# Patient Record
Sex: Male | Born: 1960 | Marital: Married | Smoking: Former smoker
Health system: Southern US, Community
[De-identification: ages and names within clinical notes are randomized; demographics above are authoritative.]

## PROBLEM LIST (undated history)

## (undated) DIAGNOSIS — E119 Type 2 diabetes mellitus without complications: Secondary | ICD-10-CM

## (undated) DIAGNOSIS — I10 Essential (primary) hypertension: Secondary | ICD-10-CM

## (undated) DIAGNOSIS — E785 Hyperlipidemia, unspecified: Secondary | ICD-10-CM

## (undated) HISTORY — DX: Essential (primary) hypertension: I10

## (undated) HISTORY — PX: GALLBLADDER SURGERY: SHX652

## (undated) HISTORY — DX: Hyperlipidemia, unspecified: E78.5

## (undated) HISTORY — DX: Type 2 diabetes mellitus without complications: E11.9

---

## 2011-12-02 ENCOUNTER — Inpatient Hospital Stay: Payer: Self-pay | Admitting: Internal Medicine

## 2011-12-02 LAB — URINALYSIS, COMPLETE
Leukocyte Esterase: NEGATIVE
Nitrite: NEGATIVE
Ph: 5 (ref 4.5–8.0)
Protein: 100
RBC,UR: 18 /HPF (ref 0–5)
Squamous Epithelial: NONE SEEN

## 2011-12-02 LAB — COMPREHENSIVE METABOLIC PANEL
Anion Gap: 17 — ABNORMAL HIGH (ref 7–16)
BUN: 16 mg/dL (ref 7–18)
Bilirubin,Total: 2.4 mg/dL — ABNORMAL HIGH (ref 0.2–1.0)
Co2: 18 mmol/L — ABNORMAL LOW (ref 21–32)
Creatinine: 0.73 mg/dL (ref 0.60–1.30)
EGFR (African American): >60
EGFR (Non-African Amer.): >60
Glucose: 205 mg/dL — ABNORMAL HIGH (ref 65–99)
Osmolality: 281 (ref 275–301)
Potassium: 4.6 mmol/L (ref 3.5–5.1)

## 2011-12-02 LAB — CBC
MCV: 86 fL (ref 80–100)
Platelet: 294 10*3/uL (ref 150–440)
WBC: 8.6 10*3/uL (ref 3.8–10.6)

## 2011-12-02 LAB — LIPASE, BLOOD: Lipase: 2842 U/L — ABNORMAL HIGH (ref 73–393)

## 2011-12-03 LAB — LIPID PANEL
Cholesterol: 209 mg/dL — ABNORMAL HIGH (ref 0–200)
HDL Cholesterol: 16 mg/dL — ABNORMAL LOW (ref 40–60)
Triglycerides: 864 mg/dL — ABNORMAL HIGH (ref 0–200)

## 2011-12-03 LAB — COMPREHENSIVE METABOLIC PANEL
Alkaline Phosphatase: 83 U/L (ref 50–136)
Bilirubin,Total: 2.2 mg/dL — ABNORMAL HIGH (ref 0.2–1.0)
Calcium, Total: 8.2 mg/dL — ABNORMAL LOW (ref 8.5–10.1)
Co2: 25 mmol/L (ref 21–32)
EGFR (African American): >60
EGFR (Non-African Amer.): >60
Glucose: 224 mg/dL — ABNORMAL HIGH (ref 65–99)
Osmolality: 285 (ref 275–301)
Sodium: 139 mmol/L (ref 136–145)
Total Protein: 6.8 g/dL (ref 6.4–8.2)

## 2011-12-03 LAB — CBC WITH DIFFERENTIAL/PLATELET
Eosinophil %: 0.3 %
HGB: 13.5 g/dL (ref 13.0–18.0)
Lymphocyte %: 32.7 %
MCH: 29.3 pg (ref 26.0–34.0)
Monocyte #: 0.2 10*3/uL (ref 0.0–0.7)
Monocyte %: 6.5 %
Neutrophil %: 60.3 %
Platelet: 196 10*3/uL (ref 150–440)
RBC: 4.59 10*6/uL (ref 4.40–5.90)
WBC: 3.7 10*3/uL — ABNORMAL LOW (ref 3.8–10.6)

## 2011-12-04 LAB — LIPASE, BLOOD: Lipase: 451 U/L — ABNORMAL HIGH (ref 73–393)

## 2011-12-05 LAB — BASIC METABOLIC PANEL
Anion Gap: 12 (ref 7–16)
BUN: 14 mg/dL (ref 7–18)
Calcium, Total: 9.2 mg/dL (ref 8.5–10.1)
Chloride: 100 mmol/L (ref 98–107)
Co2: 24 mmol/L (ref 21–32)
EGFR (Non-African Amer.): >60
Glucose: 353 mg/dL — ABNORMAL HIGH (ref 65–99)
Osmolality: 287 (ref 275–301)
Potassium: 3.7 mmol/L (ref 3.5–5.1)

## 2011-12-05 LAB — HEMOGLOBIN A1C: Hemoglobin A1C: 9.1 % — ABNORMAL HIGH (ref 4.2–6.3)

## 2011-12-05 LAB — LIPASE, BLOOD: Lipase: 206 U/L (ref 73–393)

## 2012-01-28 ENCOUNTER — Ambulatory Visit: Payer: Self-pay | Admitting: Rheumatology

## 2013-07-21 ENCOUNTER — Emergency Department: Payer: Self-pay | Admitting: Unknown Physician Specialty

## 2013-07-21 LAB — COMPREHENSIVE METABOLIC PANEL
Albumin: 4.4 g/dL (ref 3.4–5.0)
Alkaline Phosphatase: 81 U/L (ref 50–136)
Anion Gap: 5 — ABNORMAL LOW (ref 7–16)
BUN: 21 mg/dL — ABNORMAL HIGH (ref 7–18)
Calcium, Total: 9.7 mg/dL (ref 8.5–10.1)
Chloride: 102 mmol/L (ref 98–107)
Co2: 28 mmol/L (ref 21–32)
EGFR (African American): >60
EGFR (Non-African Amer.): >60
Glucose: 141 mg/dL — ABNORMAL HIGH (ref 65–99)
Osmolality: 275 (ref 275–301)
Potassium: 3.7 mmol/L (ref 3.5–5.1)
SGPT (ALT): 38 U/L (ref 12–78)
Total Protein: 8.3 g/dL — ABNORMAL HIGH (ref 6.4–8.2)

## 2013-07-21 LAB — URINALYSIS, COMPLETE
Bacteria: NONE SEEN
Bilirubin,UR: NEGATIVE
Blood: NEGATIVE
Glucose,UR: NEGATIVE mg/dL (ref 0–75)
Leukocyte Esterase: NEGATIVE
Nitrite: NEGATIVE
Ph: 5 (ref 4.5–8.0)
Protein: NEGATIVE
RBC,UR: <1 /HPF (ref 0–5)
Specific Gravity: 1.005 (ref 1.003–1.030)
Squamous Epithelial: <1
WBC UR: <1 /HPF (ref 0–5)

## 2013-07-21 LAB — TSH: Thyroid Stimulating Horm: 0.312 u[IU]/mL — ABNORMAL LOW

## 2013-07-21 LAB — CBC
HCT: 42.5 % (ref 40.0–52.0)
HGB: 14.2 g/dL (ref 13.0–18.0)
MCH: 28.9 pg (ref 26.0–34.0)
MCHC: 33.5 g/dL (ref 32.0–36.0)
MCV: 87 fL (ref 80–100)
RBC: 4.92 10*6/uL (ref 4.40–5.90)
RDW: 13.6 % (ref 11.5–14.5)
WBC: 6.9 10*3/uL (ref 3.8–10.6)

## 2013-07-21 LAB — TROPONIN I: Troponin-I: <0.02 ng/mL

## 2015-03-12 IMAGING — CR DG CHEST 1V PORT
1 series · 1 of 1 positions shown · non-contrast
Comparison: none

REASON FOR EXAM: dizziness
COMMENTS:

PROCEDURE:     DXR - DXR PORTABLE CHEST SINGLE VIEW  - July 21, 2013 [DATE]
RESULT:     Comparison is made to prior study dated 12/03/2011.

[x chest ap]
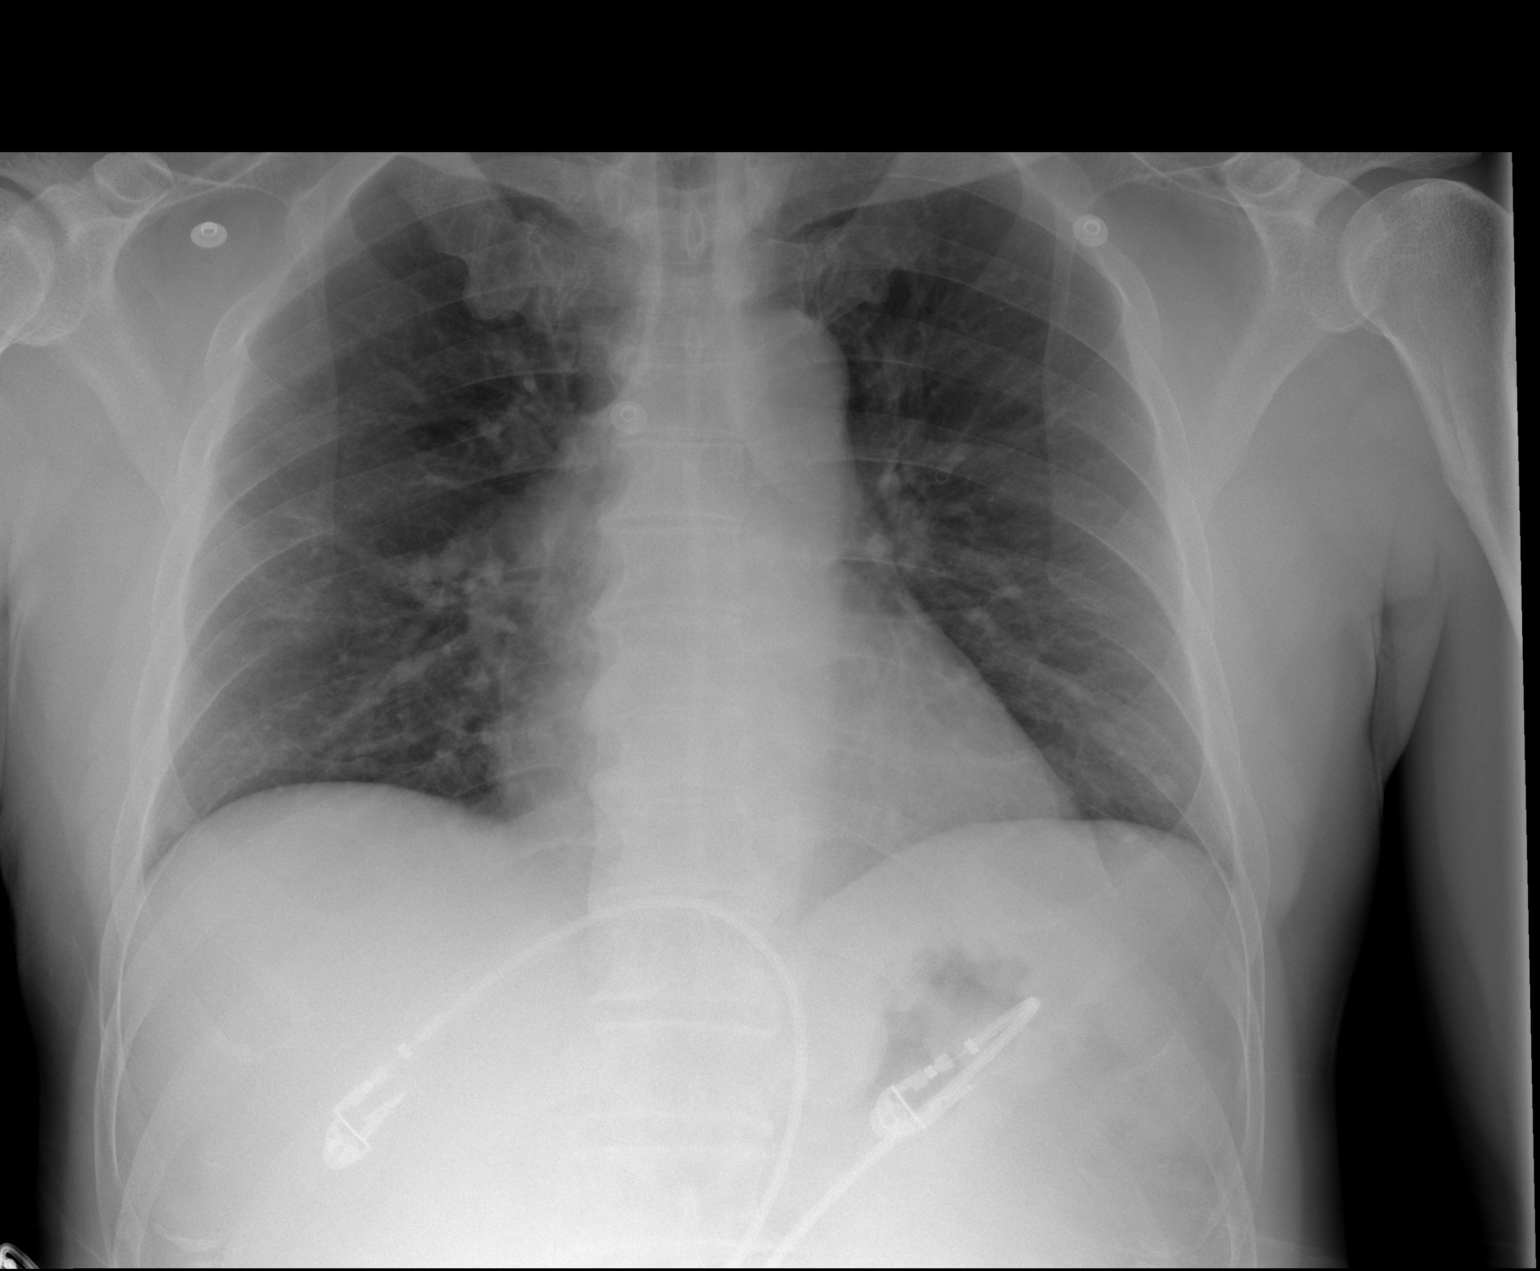

[1 of 1 positions shown; findings below may reference images not displayed]

FINDINGS: The patient has taken a shallow inspiration. There is fullness in
the right hilar region. This may be secondary to technique and repeat
two-view evaluation with deeper inspiration is recommended. Otherwise, there
is no further evidence of focal infiltrates, effusions or edema. The cardiac
silhouette and visualized bony skeleton are unremarkable.
IMPRESSION: 1. Fullness within the right hilar region as described above. Repeat
evaluation recommended
2. No further evidence of focal infiltrates, effusions or edema.

## 2015-03-24 NOTE — Discharge Summary (Signed)
PATIENT NAME:  Phillip Rodgers, Phillip Rodgers MR#:  [id number] DATE OF BIRTH:  [birth year]  DATE OF ADMISSION:  12/02/2011 DATE OF DISCHARGE:  12/05/2011  PRIMARY CARE PHYSICIAN:  The patient's primary care physician will be at the [facility].  FINAL DIAGNOSES:  1. Acute pancreatitis.  2. Hypertriglyceridemia.  3. Hypertension.  4. Diabetes.  5. Lung nodule.  6. Adrenal nodule.  7. Chronic obstructive pulmonary disease exacerbation.   DISCHARGE INSTRUCTIONS: The patient will need a CT scan of the chest and abdomen in three months.   MEDICATIONS ON DISCHARGE:  1. DuoNeb nebulizer solution as needed.  2. Combivent 2 puffs 4 times daily.  3. Multivitamin 1 tablet daily.  4. Red yeast rice 600 mg 1 capsule daily.  5. Aspirin 81 mg daily.  6. Gemfibrozil 600 mg p.o. twice a day. 7. Metoprolol 25 mg p.o. twice a day. 8. Glipizide XL 5 mg p.o. daily.  9. Percocet 5/325 1 tablet every four as needed for pain.  10. Prednisone taper as written on prescription.  11. Check fingersticks once a day.   DIET: Low sodium, 1800 ADA diet.   ACTIVITY: Activity as tolerated.   FOLLOW-UP: Follow-up at the [facility] in two weeks or first available.   REASON FOR ADMISSION: The patient was admitted 12/02/2011 and discharge 12/05/2011. The patient was admitted with abdominal pain.   HISTORY OF PRESENT ILLNESS: This is a 54 year old male who has a history of pancreatitis from gallstones nine years ago. His gallbladder was removed at that time. He started having mid epigastric abdominal pain followed by nausea and vomiting. In the Emergency Room he was found to have an acute pancreatitis. He was admitted with acute pancreatitis and given vigorous IV fluid hydration.   LABORATORY, DIAGNOSTIC, AND RADIOLOGICAL DATA: Urinalysis showed 100 mg/dL of protein, 3+ blood, glucose 500 mg/dL. Lipase 2842, glucose 205, BUN 16, creatinine 0.73, sodium 137, potassium 4.6, chloride 102, CO2 18, calcium 9.2, total bilirubin  2.4, alkaline phosphatase 119, ALT 127, AST 64, total protein 8.5, albumin 4.2, anion gap 17. White blood cell count 8.6, hemoglobin and hematocrit 14.8 and hematocrit 43.7, platelet count 294. CT scan of the abdomen and pelvis showed findings of acute pancreatitis. No peripancreatic fluid collection at this time. Findings may represent a slightly enlarged lymph node just anterior to the stomach, nonspecific, indeterminate left adrenal nodule, indeterminate 4 mm nodule in the left lower lobe of the lung. Total cholesterol 209, triglycerides 864, HDL 16. Ultrasound of the abdomen showed the pancreas is obscured. No pseudocyst or fluid seen, fatty infiltration of the liver, prior cholecystectomy. Repeat chest x-ray revealed no acute changes. Lipase came down to 4.1 on the 4th. Hemoglobin A1c 9.1. Lipase on the 5th was 206, glucose 353, BUN 14, creatinine 0.8, sodium 136, potassium 3.7, chloride 100, CO2 24, calcium 9.2.   HOSPITAL COURSE PER PROBLEM LIST:  1. For the patient's acute pancreatitis, I believe this is secondary to the triglycerides being high. The patient did improve with conservative management with IV fluid hydration. The patient was also out of his medications secondary to cost and moving to [place]. The patient was given a Percocet script upon discharge. Pancreatic enzymes normalized. He was tolerating diet at the time of discharge.  2. For the patient's hypertriglyceridemia, he was given gemfibrozil 600 mg twice a day and he is also on red yeast rice. Low triglyceride diet and carbohydrate diet discussed.  3. For his hypertension, he is unable to afford the Micardis. He  was given metoprolol instead. Blood pressure upon discharge was 144/76. ACE inhibitors can also cause pancreatitis. Will hold off on that at this point.  4. For the patient's diabetes, this is a new diagnosis. A care manager helped out with giving a glucometer. Follow-up in the [facility]. Diabetic diet discussed.  The patient was started on glipizide XL 5 mg daily. With the steroids that the patient was given upon discharge, it may elevate the sugars initially but I think the glipizide should be enough but follow-up as outpatient.  5. For the patient's lung nodule, he does have a history of smoking. Would recommend a follow-up CT scan of the chest in three months.  6. For the adrenal nodule, will recommend CT scan of the abdomen in three months. Should be able to do both those tests together and further work-up of these nodules as needed.  7. For the patient's chronic obstructive pulmonary disease exacerbation, the patient was given high dose Solu-Medrol and prednisone taper upon discharge. Lungs were clear upon discharge. The patient states that he has not smoked in a year.   TIME SPENT: Time spent on discharge was 40 minutes.   ____________________________ Tana Conch. Leslye Peer, MD [id number] D: 12/05/2011 16:09:53 ET T: 12/07/2011 16:18:41 ET JOB#: [id number]  cc: Tana Conch. Leslye Peer, MD, <Dictator> [place] MD ELECTRONICALLY SIGNED 12/18/2011 19:37

## 2015-03-24 NOTE — H&P (Signed)
PATIENT NAME:  Phillip Rodgers, Phillip Rodgers MR#:  [id number] DATE OF BIRTH:  1961/02/06  DATE OF ADMISSION:  12/02/2011   PRIMARY CARE PHYSICIAN: [place], [place]   CHIEF COMPLAINT: Abdominal pain.   HISTORY OF PRESENT ILLNESS: This is a 54 year old [place] male who had a history of pancreatitis from gallstones nine years ago. He had his gallbladder removed at that time. Yesterday he started having midepigastric abdominal pain followed by nausea and vomiting. Here in the ER he was found to have acute pancreatitis. He does not drink alcohol and has had his gallbladder out.   PAST MEDICAL HISTORY:  1. Pancreatitis in 2003. A. Status post cholecystectomy.  2. Hyperlipidemia.  3. Hypertension.   PAST SURGICAL HISTORY: Cholecystectomy in 2003.   ALLERGIES: No known drug allergies.   CURRENT MEDICATIONS:  1. Aspirin 81 mg daily.  2. Lipitor 20 mg daily.  3. Micardis 40 mg half a tablet daily.  4. Multivitamin daily. 5. TriCor 145 mg daily.   SOCIAL HISTORY: Stopped smoking and stopped drinking years ago.   FAMILY HISTORY: Significant for hypertension.   REVIEW OF SYSTEMS: CONSTITUTIONAL: No fever or chills. EYES: No blurred vision. ENT: No hearing loss. CARDIOVASCULAR: No chest pain. PULMONARY: No shortness of breath. GI: He has had nausea, vomiting, and abdominal pain. GU: No dysuria. ENDOCRINE: No heat or cold intolerance. INTEGUMENTARY: No rash. MUSCULOSKELETAL: Occasional joint pain. NEUROLOGIC: No numbness or weakness.   PHYSICAL EXAMINATION:   VITAL SIGNS: Temperature 97.8, pulse 101, respirations 18, blood pressure 149/86.   GENERAL: This is a well nourished male in no acute distress.   HEENT: Pupils are equal, round, and reactive to light. Sclerae nonicteric. Oral mucosa is moist. Oropharynx is clear. Nasopharynx is clear.   NECK: Supple. No JVD, lymphadenopathy, or thyromegaly.   CARDIOVASCULAR: Tachy with no murmurs.   LUNGS: Clear to auscultation. No dullness to percussion.  He is not using accessory muscles.   ABDOMEN: Soft, nondistended. Bowel sounds are positive. No hepatosplenomegaly. No masses. There is tenderness in the epigastric area with some mild guarding.   EXTREMITIES: There is no edema. No joint deformity.   NEUROLOGIC: Cranial nerves II through XII are intact. He is alert and oriented x4.   SKIN: Moist with no rash.   LABORATORY, DIAGNOSTIC, AND RADIOLOGICAL DATA: White blood cells 8.6, hemoglobin 14.8, BUN 16, creatinine 0.73. Lipase 2842.   ASSESSMENT AND PLAN:  1. Acute pancreatitis, etiology unknown at this time. He had his gallbladder taken out years ago. Does not drink alcohol. His medication list does not show any medications that are known to cause pancreatitis. Will go ahead and treat him with IV fluids aggressively, pain medications, and antiemetics as needed. Will go ahead and get an ultrasound to look at the ducts, especially the common bowel duct. I doubt he would have retained stone this far out from a cholecystectomy but if he has some stenosis or any other causality would want to evaluate for this. If he does not improve, he may possibly need ERCP.  2. Hypertension. Will continue his current medications.  3. Hyperlipidemia. Will continue medications for this.   TIME SPENT ON ADMISSION: 45 minutes.   ____________________________ Baxter Hire, MD [id number] D: 12/02/2011 17:18:06 ET T: 12/02/2011 17:41:07 ET JOB#: [id number]  cc: Baxter Hire, MD, <Dictator> Baxter Hire MD ELECTRONICALLY SIGNED 12/04/2011 10:12

## 2015-10-21 ENCOUNTER — Ambulatory Visit: Payer: No Typology Code available for payment source | Admitting: Family Medicine

## 2018-01-11 ENCOUNTER — Ambulatory Visit
Admission: RE | Admit: 2018-01-11 | Payer: Managed Care, Other (non HMO) | Source: Ambulatory Visit | Admitting: Gastroenterology

## 2018-01-11 ENCOUNTER — Encounter: Admission: RE | Payer: Self-pay | Source: Ambulatory Visit

## 2018-01-11 SURGERY — COLONOSCOPY
Anesthesia: General

## 2020-05-22 LAB — EXTERNAL GENERIC LAB PROCEDURE: COLOGUARD: NEGATIVE

## 2020-05-22 LAB — COLOGUARD: COLOGUARD: NEGATIVE

## 2021-03-07 ENCOUNTER — Other Ambulatory Visit: Payer: Self-pay | Admitting: Internal Medicine

## 2021-03-07 DIAGNOSIS — D2112 Benign neoplasm of connective and other soft tissue of left upper limb, including shoulder: Secondary | ICD-10-CM

## 2022-08-17 ENCOUNTER — Ambulatory Visit (INDEPENDENT_AMBULATORY_CARE_PROVIDER_SITE_OTHER): Payer: Commercial Managed Care - HMO | Admitting: Urology

## 2022-08-17 VITALS — BP 108/69 | HR 74 | Ht 69.0 in | Wt 160.0 lb

## 2022-08-17 DIAGNOSIS — N401 Enlarged prostate with lower urinary tract symptoms: Secondary | ICD-10-CM

## 2022-08-17 DIAGNOSIS — N400 Enlarged prostate without lower urinary tract symptoms: Secondary | ICD-10-CM

## 2022-08-17 LAB — BLADDER SCAN AMB NON-IMAGING: Scan Result: 25

## 2022-08-17 MED ORDER — MIRABEGRON ER 25 MG PO TB24: 25.0000 mg | ORAL_TABLET | Freq: Every day | ORAL | 0 refills | Status: DC

## 2022-08-17 NOTE — Patient Instructions (Signed)
Stop tamsulosin and start myrbetriq

## 2022-08-18 LAB — URINALYSIS, COMPLETE
Bilirubin, UA: NEGATIVE
Glucose, UA: NEGATIVE
Ketones, UA: NEGATIVE
Leukocytes,UA: NEGATIVE
Nitrite, UA: NEGATIVE
Protein,UA: NEGATIVE
RBC, UA: NEGATIVE
Specific Gravity, UA: 1.02 (ref 1.005–1.030)
Urobilinogen, Ur: 0.2 mg/dL (ref 0.2–1.0)
pH, UA: 5.5 (ref 5.0–7.5)

## 2022-08-18 LAB — MICROSCOPIC EXAMINATION: Bacteria, UA: NONE SEEN

## 2022-08-18 NOTE — Progress Notes (Unsigned)
   08/17/2022 7:18 AM   Phillip Rodgers 1960/12/12 [id number]  Referring provider: Jodi Marble, MD [place],  [place] [address]  Chief Complaint  Patient presents with   Benign Prostatic Hypertrophy    HPI: Phillip Rodgers is a 61 y.o. male referred for evaluation of lower urinary tract symptoms.  3 year history of lower urinary tract symptoms. States he was placed on tamsulosin and dutasteride ~ 1.5 years ago with improvement in his lower urinary symptoms. Most bothersome symptom at present is a urinary urgency with occasional double voiding.  Has nocturia x2 and mild urinary hesitancy IPSS 18/35 Denies dysuria, gross hematuria No flank, abdominal or pelvic pain   PMH: Arthritis  Diabetes (CMS-HCC)  High blood pressure  High cholesterol   Surgical History: Cholecystectomy  Home Medications:  Allergies as of 08/17/2022   No Known Allergies      Medication List        Accurate as of August 17, 2022 11:59 PM. If you have any questions, ask your nurse or doctor.          amLODipine 10 MG tablet Commonly known as: NORVASC Take by mouth.   ascorbic acid 500 MG tablet Commonly known as: VITAMIN C Take by mouth.   atorvastatin 40 MG tablet Commonly known as: LIPITOR Take by mouth.   fenofibrate 145 MG tablet Commonly known as: TRICOR Take by mouth.   fluticasone-salmeterol 115-21 MCG/ACT inhaler Commonly known as: ADVAIR HFA Inhale into the lungs.   Janumet 50-1000 MG tablet Generic drug: sitaGLIPtin-metformin Take by mouth.   ketoconazole 2 % cream Commonly known as: NIZORAL   metFORMIN 500 MG tablet Commonly known as: GLUCOPHAGE Take by mouth.   mirabegron ER 25 MG Tb24 tablet Commonly known as: MYRBETRIQ Take 1 tablet (25 mg total) by mouth daily.   Multi-Vitamin tablet Take 1 tablet by mouth daily.   sildenafil 20 MG tablet Commonly known as: REVATIO Take 2-5 tabs PRN for ED   tamsulosin 0.4 MG Caps  capsule Commonly known as: FLOMAX Take 0.4 mg by mouth daily.        Allergies: No Known Allergies  Family History: No family history on file.  Social History:  has no history on file for tobacco use, alcohol use, and drug use.   Physical Exam: BP 108/69   Pulse 74   Ht '5\' 9"'$  (1.753 m)   Wt 160 lb (72.6 kg)   BMI 23.63 kg/m   Constitutional:  Alert and oriented, No acute distress. HEENT: [place] AT GU: Prostate 40 g, smooth without nodules Skin: No rashes, bruises or suspicious lesions. Neurologic: Grossly intact, no focal deficits, moving all 4 extremities. Psychiatric: Normal mood and affect.  Laboratory Data:  Urinalysis Dipstick/microscopy negative    Assessment & Plan:    1.  BPH with LUTS Symptoms improved on combination therapy dutasteride/tamsulosin however does have bothersome urgency with postvoid dribbling/double voiding He has been on dutasteride for over 1 year and will discontinue the tamsulosin Add Myrbetriq 25 mg daily-samples given No PSA results and forwarded records.  Will have office check with referring practice regarding any prior PSA results PA follow-up 1 month for symptom check.      Abbie Sons, [place] [address], [place], [place] [address] [phone]

## 2022-08-19 ENCOUNTER — Encounter: Payer: Self-pay | Admitting: Internal Medicine

## 2022-08-19 ENCOUNTER — Telehealth: Payer: Self-pay | Admitting: *Deleted

## 2022-08-19 ENCOUNTER — Encounter: Payer: Self-pay | Admitting: Urology

## 2022-08-19 NOTE — Telephone Encounter (Signed)
Err

## 2022-09-16 ENCOUNTER — Encounter: Payer: Self-pay | Admitting: Physician Assistant

## 2022-09-16 ENCOUNTER — Ambulatory Visit (INDEPENDENT_AMBULATORY_CARE_PROVIDER_SITE_OTHER): Payer: No Typology Code available for payment source | Admitting: Physician Assistant

## 2022-09-16 VITALS — BP 111/70 | HR 73 | Ht 69.0 in | Wt 159.0 lb

## 2022-09-16 DIAGNOSIS — N3943 Post-void dribbling: Secondary | ICD-10-CM

## 2022-09-16 DIAGNOSIS — R3915 Urgency of urination: Secondary | ICD-10-CM | POA: Diagnosis not present

## 2022-09-16 DIAGNOSIS — N401 Enlarged prostate with lower urinary tract symptoms: Secondary | ICD-10-CM | POA: Diagnosis not present

## 2022-09-16 LAB — BLADDER SCAN AMB NON-IMAGING

## 2022-09-16 MED ORDER — MIRABEGRON ER 25 MG PO TB24: 25.0000 mg | ORAL_TABLET | Freq: Every day | ORAL | 11 refills | Status: DC

## 2022-09-16 NOTE — Patient Instructions (Signed)
Continue dutasteride and Myrbetriq. I have sent a 1-year supply of Myrbetriq to your pharmacy.  You may stay off of the tamsulosin.

## 2022-09-16 NOTE — Progress Notes (Signed)
09/16/2022 1:22 PM   Phillip Rodgers [birth year] [id number]  CC: Chief Complaint  Patient presents with   Benign Prostatic Hypertrophy   HPI: Phillip Rodgers is a 61 y.o. male with PMH BPH with LUTS previously on dutasteride and tamsulosin who recently stopped tamsulosin and started a trial of Myrbetriq 25 mg who presents today for symptom recheck.   Today he reports symptomatic improvement on his new regimen of dutasteride and Myrbetriq 25 mg daily.  His postvoid dribbling has stopped and he feels that his urine stream is stronger than before.  He describes nocturia x1-3, however he admits that he notices a big difference depending on how late in the evening he drinks fluid.  Overall, he is pleased with his new drug regimen.  Since he was last seen in clinic, we obtained his outside medical records from his PCP.  PSA history as below.  2017: 0.98 2021: 1.1  PVR 63m.  PMH: No past medical history on file.  Surgical History: No past surgical history on file.  Home Medications:  Allergies as of 09/16/2022   No Known Allergies      Medication List        Accurate as of September 16, 2022  1:22 PM. If you have any questions, ask your nurse or doctor.          STOP taking these medications    Janumet 50-1000 MG tablet Generic drug: sitaGLIPtin-metformin Stopped by: SDebroah Loop PA-C   tamsulosin 0.4 MG Caps capsule Commonly known as: FLOMAX Stopped by: SDebroah Loop PA-C       TAKE these medications    amLODipine 10 MG tablet Commonly known as: NORVASC Take by mouth.   ascorbic acid 500 MG tablet Commonly known as: VITAMIN C Take by mouth.   atorvastatin 40 MG tablet Commonly known as: LIPITOR Take by mouth.   benazepril 20 MG tablet Commonly known as: LOTENSIN Take by mouth.   fenofibrate 145 MG tablet Commonly known as: TRICOR Take by mouth.   fluticasone-salmeterol 115-21 MCG/ACT inhaler Commonly known as: ADVAIR HFA Inhale  into the lungs.   ketoconazole 2 % cream Commonly known as: NIZORAL   metFORMIN 500 MG tablet Commonly known as: GLUCOPHAGE Take by mouth.   mirabegron ER 25 MG Tb24 tablet Commonly known as: MYRBETRIQ Take 1 tablet (25 mg total) by mouth daily.   Multi-Vitamin tablet Take 1 tablet by mouth daily.   sildenafil 20 MG tablet Commonly known as: REVATIO Take 2-5 tabs PRN for ED        Allergies:  No Known Allergies  Family History: No family history on file.  Social History:   reports that he quit smoking about 15 years ago. His smoking use included cigarettes. He started smoking about 46 years ago. He does not have any smokeless tobacco history on file. No history on file for alcohol use and drug use.  Physical Exam: BP 111/70   Pulse 73   Ht '5\' 9"'$  (1.753 m)   Wt 159 lb (72.1 kg)   BMI 23.48 kg/m   Constitutional:  Alert and oriented, no acute distress, nontoxic appearing HEENT: [place], AT Cardiovascular: No clubbing, cyanosis, or edema Respiratory: Normal respiratory effort, no increased work of breathing Skin: No rashes, bruises or suspicious lesions Neurologic: Grossly intact, no focal deficits, moving all 4 extremities Psychiatric: Normal mood and affect  Laboratory Data: Results for orders placed or performed in visit on 09/16/22  Bladder Scan (Post Void Residual) in office  Result Value  Ref Range   Scan Result 22ML    Assessment & Plan:   1. Benign prostatic hyperplasia with post-void dribbling Significant symptomatic improvement on new regimen of dutasteride and Myrbetriq 25 mg daily.  PSA normal, will plan for repeat every 2 to 4 years for now. - Bladder Scan (Post Void Residual) in office  2. Urinary urgency Likely secondary to #1 above. He is emptying his bladder appropriately on the beta 3 agonist.  We will plan to continue this. - mirabegron ER (MYRBETRIQ) 25 MG TB24 tablet; Take 1 tablet (25 mg total) by mouth daily.  Dispense: 30 tablet; Refill: 11    Return in about 1 year (around 09/17/2023) for Annual DRE/IPSS/PVR, possible PSA with Dr. Bernardo Heater.  Debroah Loop, PA-C  [facility] [address], [place], [place] [address] [phone]

## 2022-10-28 ENCOUNTER — Telehealth: Payer: Self-pay | Admitting: Urology

## 2022-10-28 NOTE — Telephone Encounter (Signed)
I can give him two boxes. Thanks

## 2022-10-28 NOTE — Telephone Encounter (Signed)
Pt wants to know if he can have some samples of Myrbetriq 25 mg.  He will change insurance the first of the year and hopefully they will cover after.  He wants enough to last til the end of the year.

## 2023-01-11 ENCOUNTER — Other Ambulatory Visit: Payer: Self-pay | Admitting: Internal Medicine

## 2023-01-21 ENCOUNTER — Other Ambulatory Visit: Payer: Self-pay | Admitting: Internal Medicine

## 2023-02-01 ENCOUNTER — Encounter: Payer: Self-pay | Admitting: Internal Medicine

## 2023-03-13 ENCOUNTER — Other Ambulatory Visit: Payer: Self-pay | Admitting: Internal Medicine

## 2023-04-20 ENCOUNTER — Other Ambulatory Visit: Payer: Self-pay

## 2023-04-20 MED ORDER — PIOGLITAZONE HCL 15 MG PO TABS: 15.0000 mg | ORAL_TABLET | Freq: Every morning | ORAL | 3 refills | Status: DC

## 2023-09-21 ENCOUNTER — Ambulatory Visit: Payer: Self-pay | Admitting: Internal Medicine

## 2023-09-21 ENCOUNTER — Encounter: Payer: Self-pay | Admitting: Internal Medicine

## 2023-09-21 VITALS — BP 110/72 | HR 69 | Ht 69.0 in | Wt 154.4 lb

## 2023-09-21 DIAGNOSIS — E782 Mixed hyperlipidemia: Secondary | ICD-10-CM | POA: Insufficient documentation

## 2023-09-21 DIAGNOSIS — N401 Enlarged prostate with lower urinary tract symptoms: Secondary | ICD-10-CM | POA: Insufficient documentation

## 2023-09-21 DIAGNOSIS — R351 Nocturia: Secondary | ICD-10-CM

## 2023-09-21 DIAGNOSIS — I10 Essential (primary) hypertension: Secondary | ICD-10-CM | POA: Insufficient documentation

## 2023-09-21 DIAGNOSIS — E119 Type 2 diabetes mellitus without complications: Secondary | ICD-10-CM

## 2023-09-21 DIAGNOSIS — E1142 Type 2 diabetes mellitus with diabetic polyneuropathy: Secondary | ICD-10-CM | POA: Insufficient documentation

## 2023-09-21 MED ORDER — METFORMIN HCL ER 500 MG PO TB24
2000.0000 mg | ORAL_TABLET | Freq: Every day | ORAL | 0 refills | Status: DC
Start: 2023-09-21 — End: 2024-01-07

## 2023-09-21 MED ORDER — ATORVASTATIN CALCIUM 40 MG PO TABS
40.0000 mg | ORAL_TABLET | Freq: Every day | ORAL | 0 refills | Status: DC
Start: 2023-09-21 — End: 2024-01-07

## 2023-09-21 MED ORDER — FENOFIBRATE 145 MG PO TABS: 145.0000 mg | ORAL_TABLET | Freq: Every day | ORAL | 2 refills | Status: DC

## 2023-09-21 MED ORDER — DUTASTERIDE 0.5 MG PO CAPS: 0.5000 mg | ORAL_CAPSULE | Freq: Every morning | ORAL | 1 refills | Status: DC

## 2023-09-21 MED ORDER — PIOGLITAZONE HCL 15 MG PO TABS: 15.0000 mg | ORAL_TABLET | Freq: Every morning | ORAL | 0 refills | Status: DC

## 2023-09-21 NOTE — Progress Notes (Signed)
Established Patient Office Visit  Subjective:  Patient ID: Phillip Rodgers, male    DOB: 01-27-1961  Age: 62 y.o. MRN: [id number]  No chief complaint on file.   No new complaints, here for lab review and medication refills. Returned from the [place] after a year, blood pressure well controlled and A1c was 6.6.    No other concerns at this time.   No past medical history on file.  No past surgical history on file.  Social History   Socioeconomic History   Marital status: Married    Spouse name: Not on file   Number of children: Not on file   Years of education: Not on file   Highest education level: Not on file  Occupational History   Not on file  Tobacco Use   Smoking status: Former    Current packs/day: 0.00    Types: Cigarettes    Start date: 80    Quit date: 2008    Years since quitting: 16.8   Smokeless tobacco: Never  Substance and Sexual Activity   Alcohol use: Never   Drug use: Never   Sexual activity: Yes  Other Topics Concern   Not on file  Social History Narrative   Not on file   Social Determinants of Health   Financial Resource Strain: Not on file  Food Insecurity: Not on file  Transportation Needs: Not on file  Physical Activity: Not on file  Stress: Not on file  Social Connections: Not on file  Intimate Partner Violence: Not on file    No family history on file.  No Known Allergies  Review of Systems  Neurological:        Numbness in the sole of his feet       Objective:   BP 110/72   Pulse 69   Ht 5\' 9"  (1.753 m)   Wt 154 lb 6.4 oz (70 kg)   SpO2 96%   BMI 22.80 kg/m   Vitals:   09/21/23 1026  BP: 110/72  Pulse: 69  Height: 5\' 9"  (1.753 m)  Weight: 154 lb 6.4 oz (70 kg)  SpO2: 96%  BMI (Calculated): 22.79    Physical Exam Vitals reviewed.  Constitutional:      Appearance: Normal appearance.  HENT:     Head: Normocephalic.     Left Ear: There is no impacted cerumen.     Nose: Nose normal.      Mouth/Throat:     Mouth: Mucous membranes are moist.     Pharynx: No posterior oropharyngeal erythema.  Eyes:     Extraocular Movements: Extraocular movements intact.     Pupils: Pupils are equal, round, and reactive to light.  Cardiovascular:     Rate and Rhythm: Regular rhythm.     Chest Wall: PMI is not displaced.     Pulses: Normal pulses.     Heart sounds: Normal heart sounds. No murmur heard. Pulmonary:     Effort: Pulmonary effort is normal.     Breath sounds: Normal air entry. No rhonchi or rales.  Abdominal:     General: Abdomen is flat. Bowel sounds are normal. There is no distension.     Palpations: Abdomen is soft. There is no hepatomegaly, splenomegaly or mass.     Tenderness: There is no abdominal tenderness.  Musculoskeletal:        General: Normal range of motion.     Cervical back: Normal range of motion and neck supple.     Right lower  leg: No edema.     Left lower leg: No edema.  Skin:    General: Skin is warm and dry.  Neurological:     General: No focal deficit present.     Mental Status: He is alert and oriented to person, place, and time.     Cranial Nerves: No cranial nerve deficit.     Motor: No weakness.  Psychiatric:        Mood and Affect: Mood normal.        Behavior: Behavior normal.      No results found for any visits on 09/21/23.  No results found for this or any previous visit (from the past 2160 hour(Eldor Conaway)).    Assessment & Plan:  As per problem list. Problem List Items Addressed This Visit       Cardiovascular and Mediastinum   Primary hypertension   Relevant Medications   atorvastatin (LIPITOR) 40 MG tablet   Other Relevant Orders   CBC With Diff/Platelet   Comprehensive metabolic panel     Endocrine   Diabetic peripheral neuropathy associated with type 2 diabetes mellitus (HCC)   Relevant Medications   pioglitazone (ACTOS) 15 MG tablet   metFORMIN (GLUCOPHAGE-XR) 500 MG 24 hr tablet   atorvastatin (LIPITOR) 40 MG tablet      Other   BPH associated with nocturia   Relevant Medications   dutasteride (AVODART) 0.5 MG capsule   Mixed hyperlipidemia   Relevant Medications   atorvastatin (LIPITOR) 40 MG tablet   Other Relevant Orders   Lipid panel   Other Visit Diagnoses     Controlled type 2 diabetes mellitus without complication, without long-term current use of insulin (HCC)    -  Primary   Relevant Medications   pioglitazone (ACTOS) 15 MG tablet   metFORMIN (GLUCOPHAGE-XR) 500 MG 24 hr tablet   atorvastatin (LIPITOR) 40 MG tablet   Other Relevant Orders   Hemoglobin A1c       Return in about 1 month (around 10/22/2023) for cpe with labs prior.   Total time spent: 20 minutes  Luna Fuse, MD  09/21/2023   This document may have been prepared by [facility] Voice Recognition software and as such may include unintentional dictation errors.

## 2023-09-23 ENCOUNTER — Ambulatory Visit: Payer: No Typology Code available for payment source | Admitting: Urology

## 2023-10-08 ENCOUNTER — Other Ambulatory Visit: Payer: Self-pay | Admitting: Internal Medicine

## 2023-10-08 DIAGNOSIS — I10 Essential (primary) hypertension: Secondary | ICD-10-CM

## 2023-10-08 DIAGNOSIS — E119 Type 2 diabetes mellitus without complications: Secondary | ICD-10-CM

## 2023-10-08 MED ORDER — EMPAGLIFLOZIN 25 MG PO TABS: 25.0000 mg | ORAL_TABLET | Freq: Every day | ORAL | 2 refills | Status: DC

## 2023-10-08 MED ORDER — BENAZEPRIL HCL 20 MG PO TABS: 20.0000 mg | ORAL_TABLET | Freq: Every day | ORAL | 0 refills | Status: DC

## 2023-10-08 MED ORDER — AMLODIPINE BESYLATE 10 MG PO TABS: 10.0000 mg | ORAL_TABLET | Freq: Every day | ORAL | 1 refills | Status: DC

## 2023-11-03 ENCOUNTER — Ambulatory Visit: Payer: No Typology Code available for payment source | Admitting: Urology

## 2023-12-16 ENCOUNTER — Other Ambulatory Visit: Payer: Self-pay

## 2023-12-17 ENCOUNTER — Ambulatory Visit: Payer: Self-pay | Admitting: Internal Medicine

## 2023-12-20 ENCOUNTER — Ambulatory Visit: Payer: Self-pay | Admitting: Physician Assistant

## 2023-12-23 ENCOUNTER — Telehealth: Payer: Self-pay | Admitting: Family Medicine

## 2023-12-23 NOTE — Telephone Encounter (Signed)
Patient is needing to be seen sooner then the appt that Is scheduled for may 2025 he see's a urologist and needs to have blood drawn before may he would like a call back regarding this

## 2023-12-30 ENCOUNTER — Ambulatory Visit: Payer: Self-pay | Admitting: Physician Assistant

## 2023-12-30 ENCOUNTER — Encounter: Payer: Self-pay | Admitting: Physician Assistant

## 2023-12-30 VITALS — BP 123/70 | HR 75 | Ht 69.0 in | Wt 154.0 lb

## 2023-12-30 DIAGNOSIS — N3943 Post-void dribbling: Secondary | ICD-10-CM

## 2023-12-30 DIAGNOSIS — N401 Enlarged prostate with lower urinary tract symptoms: Secondary | ICD-10-CM

## 2023-12-30 LAB — BLADDER SCAN AMB NON-IMAGING: Scan Result: 23

## 2023-12-30 MED ORDER — TAMSULOSIN HCL 0.4 MG PO CAPS: 0.4000 mg | ORAL_CAPSULE | Freq: Every day | ORAL | 11 refills | Status: DC

## 2023-12-30 MED ORDER — MIRABEGRON ER 25 MG PO TB24: 25.0000 mg | ORAL_TABLET | Freq: Every day | ORAL | 11 refills | Status: DC

## 2023-12-30 NOTE — Patient Instructions (Signed)
CONTINUE Avodart (dutasteride). START Flomax (tamsulosin) and Myrbetriq (mirabegron). If the Myrbetriq (mirabegron) is too expensive, then start just the Flomax and we will recheck your symptoms in 6 weeks.

## 2023-12-30 NOTE — Progress Notes (Signed)
12/30/2023 11:10 AM   Phillip Rodgers [birth year] [id number]  CC: Chief Complaint  Patient presents with   Benign Prostatic Hypertrophy   HPI: Phillip Rodgers is a 63 y.o. male with PMH BPH with LUTS who presents today for follow-up.   He was previously on dutasteride and Flomax, but stopped the Flomax and then started Myrbetriq 25 mg.  He has not been seen in our clinic for over 1 year.  Today he reports [place] returned to the [place] and was living there for about a year.  During that time, he stopped dutasteride and was prescribed Myrbetriq 25 mg and silodosin.  He returned to the [place] 2 to 3 months ago and resumed dutasteride about a month ago.  He is not on any other medications at this time.  Notably, he has had some issues with insurance coverage of his Myrbetriq.  IPSS 26/unhappy as below.  PVR 23 mL.   IPSS     Row Name 12/30/23 1100         International Prostate Symptom Score   How often have you had the sensation of not emptying your bladder? Almost always     How often have you had to urinate less than every two hours? About half the time     How often have you found you stopped and started again several times when you urinated? Less than half the time     How often have you found it difficult to postpone urination? About half the time     How often have you had a weak urinary stream? Almost always     How often have you had to strain to start urination? Almost always     How many times did you typically get up at night to urinate? 3 Times     Total IPSS Score 26       Quality of Life due to urinary symptoms   If you were to spend the rest of your life with your urinary condition just the way it is now how would you feel about that? Unhappy               PMH: No past medical history on file.  Surgical History: No past surgical history on file.  Home Medications:  Allergies as of 12/30/2023   No Known Allergies      Medication List        Accurate as  of December 30, 2023 11:10 AM. If you have any questions, ask your nurse or doctor.          amLODipine-benazepril 10-20 MG capsule Commonly known as: LOTREL TAKE ONE CAPSULE BY MOUTH EVERY MORNING   ascorbic acid 500 MG tablet Commonly known as: VITAMIN C Take by mouth.   atorvastatin 40 MG tablet Commonly known as: LIPITOR Take 1 tablet (40 mg total) by mouth at bedtime.   dutasteride 0.5 MG capsule Commonly known as: AVODART Take 1 capsule (0.5 mg total) by mouth every morning.   empagliflozin 25 MG Tabs tablet Commonly known as: Jardiance Take 1 tablet (25 mg total) by mouth daily before breakfast.   fenofibrate 145 MG tablet Commonly known as: TRICOR Take 1 tablet (145 mg total) by mouth daily.   fluticasone-salmeterol 115-21 MCG/ACT inhaler Commonly known as: ADVAIR HFA Inhale into the lungs.   ketoconazole 2 % cream Commonly known as: NIZORAL   metFORMIN 500 MG 24 hr tablet Commonly known as: GLUCOPHAGE-XR Take 4 tablets (2,000 mg total) by mouth daily  with breakfast.   mirabegron ER 25 MG Tb24 tablet Commonly known as: MYRBETRIQ Take 1 tablet (25 mg total) by mouth daily.   Multi-Vitamin tablet Take 1 tablet by mouth daily.   pioglitazone 15 MG tablet Commonly known as: ACTOS Take 1 tablet (15 mg total) by mouth every morning.   sildenafil 20 MG tablet Commonly known as: REVATIO Take 2-5 tabs PRN for ED        Allergies:  No Known Allergies  Family History: No family history on file.  Social History:   reports that he quit smoking about 17 years ago. His smoking use included cigarettes. He started smoking about 48 years ago. He has never used smokeless tobacco. He reports that he does not drink alcohol and does not use drugs.  Physical Exam: BP 123/70   Pulse 75   Ht 5\' 9"  (1.753 m)   Wt 154 lb (69.9 kg)   BMI 22.74 kg/m   Constitutional:  Alert and oriented, no acute distress, nontoxic appearing HEENT: [place], AT Cardiovascular: No  clubbing, cyanosis, or edema Respiratory: Normal respiratory effort, no increased work of breathing Skin: No rashes, bruises or suspicious lesions Neurologic: Grossly intact, no focal deficits, moving all 4 extremities Psychiatric: Normal mood and affect  Laboratory Data: Results for orders placed or performed in visit on 12/30/23  Bladder Scan (Post Void Residual) in office   Collection Time: 12/30/23 11:00 AM  Result Value Ref Range   Scan Result 23    Assessment & Plan:   1. Benign prostatic hyperplasia with post-void dribbling (Primary) Continue dutasteride.  Will resume Flomax and mirabegron 25 mg and plan for symptom recheck in 6 weeks.  We discussed that if Myrbetriq is too expensive, then he can resume Flomax alone.  I do not want to check his PSA yet given that he only resumed dutasteride last month after being off it for about a year.  Would recommend waiting about 6 months before checking a PSA. - Bladder Scan (Post Void Residual) in office - mirabegron ER (MYRBETRIQ) 25 MG TB24 tablet; Take 1 tablet (25 mg total) by mouth daily.  Dispense: 30 tablet; Refill: 11 - tamsulosin (FLOMAX) 0.4 MG CAPS capsule; Take 1 capsule (0.4 mg total) by mouth daily.  Dispense: 30 capsule; Refill: 11   Return in about 6 weeks (around 02/10/2024) for Symptom recheck with IPSS, PVR.  Carman Ching, PA-C  [facility] [address], [address] [place], [place] [address] [phone]

## 2024-01-02 ENCOUNTER — Other Ambulatory Visit: Payer: Self-pay | Admitting: Internal Medicine

## 2024-01-02 DIAGNOSIS — E782 Mixed hyperlipidemia: Secondary | ICD-10-CM

## 2024-01-02 DIAGNOSIS — I10 Essential (primary) hypertension: Secondary | ICD-10-CM

## 2024-01-03 ENCOUNTER — Other Ambulatory Visit: Payer: Self-pay

## 2024-01-03 DIAGNOSIS — E782 Mixed hyperlipidemia: Secondary | ICD-10-CM

## 2024-01-03 DIAGNOSIS — E119 Type 2 diabetes mellitus without complications: Secondary | ICD-10-CM

## 2024-01-03 DIAGNOSIS — N401 Enlarged prostate with lower urinary tract symptoms: Secondary | ICD-10-CM

## 2024-01-03 NOTE — Telephone Encounter (Signed)
Left message to return call to our office.  Called pt to inform him that orders are in. I can print off and have in front office for him to take to [facility] to get blood work done.

## 2024-01-05 ENCOUNTER — Other Ambulatory Visit: Payer: Self-pay

## 2024-01-05 DIAGNOSIS — E782 Mixed hyperlipidemia: Secondary | ICD-10-CM

## 2024-01-05 DIAGNOSIS — E119 Type 2 diabetes mellitus without complications: Secondary | ICD-10-CM

## 2024-01-05 DIAGNOSIS — I10 Essential (primary) hypertension: Secondary | ICD-10-CM

## 2024-01-06 LAB — HEMOGLOBIN A1C
Est. average glucose Bld gHb Est-mCnc: 160 mg/dL
Hgb A1c MFr Bld: 7.2 % — ABNORMAL HIGH (ref 4.8–5.6)

## 2024-01-06 LAB — CBC WITH DIFF/PLATELET
Basophils Absolute: 0.1 10*3/uL (ref 0.0–0.2)
Basos: 1 %
EOS (ABSOLUTE): 0.2 10*3/uL (ref 0.0–0.4)
Eos: 4 %
Hematocrit: 39.2 % (ref 37.5–51.0)
Hemoglobin: 12.5 g/dL — ABNORMAL LOW (ref 13.0–17.7)
Immature Grans (Abs): 0 10*3/uL (ref 0.0–0.1)
Immature Granulocytes: 0 %
Lymphocytes Absolute: 1.9 10*3/uL (ref 0.7–3.1)
Lymphs: 33 %
MCH: 28.2 pg (ref 26.6–33.0)
MCHC: 31.9 g/dL (ref 31.5–35.7)
MCV: 89 fL (ref 79–97)
Monocytes Absolute: 0.5 10*3/uL (ref 0.1–0.9)
Monocytes: 9 %
Neutrophils Absolute: 3.1 10*3/uL (ref 1.4–7.0)
Neutrophils: 53 %
Platelets: 266 10*3/uL (ref 150–450)
RBC: 4.43 x10E6/uL (ref 4.14–5.80)
RDW: 14 % (ref 11.6–15.4)
WBC: 5.8 10*3/uL (ref 3.4–10.8)

## 2024-01-06 LAB — COMPREHENSIVE METABOLIC PANEL
ALT: 20 [IU]/L (ref 0–44)
AST: 15 [IU]/L (ref 0–40)
Albumin: 4.4 g/dL (ref 3.9–4.9)
Alkaline Phosphatase: 70 [IU]/L (ref 44–121)
BUN/Creatinine Ratio: 25 — ABNORMAL HIGH (ref 10–24)
BUN: 22 mg/dL (ref 8–27)
Bilirubin Total: 0.5 mg/dL (ref 0.0–1.2)
CO2: 22 mmol/L (ref 20–29)
Calcium: 9.9 mg/dL (ref 8.6–10.2)
Chloride: 103 mmol/L (ref 96–106)
Creatinine, Ser: 0.88 mg/dL (ref 0.76–1.27)
Globulin, Total: 2.5 g/dL (ref 1.5–4.5)
Glucose: 134 mg/dL — ABNORMAL HIGH (ref 70–99)
Potassium: 4.2 mmol/L (ref 3.5–5.2)
Sodium: 140 mmol/L (ref 134–144)
Total Protein: 6.9 g/dL (ref 6.0–8.5)
eGFR: 97 mL/min/{1.73_m2} (ref >59)

## 2024-01-06 LAB — LIPID PANEL
Chol/HDL Ratio: 2.5 {ratio} (ref 0.0–5.0)
Cholesterol, Total: 157 mg/dL (ref 100–199)
HDL: 63 mg/dL (ref >39)
LDL Chol Calc (NIH): 77 mg/dL (ref 0–99)
Triglycerides: 90 mg/dL (ref 0–149)
VLDL Cholesterol Cal: 17 mg/dL (ref 5–40)

## 2024-01-07 ENCOUNTER — Encounter: Payer: Self-pay | Admitting: Internal Medicine

## 2024-01-07 ENCOUNTER — Other Ambulatory Visit: Payer: Self-pay

## 2024-01-07 ENCOUNTER — Ambulatory Visit: Payer: Self-pay | Admitting: Internal Medicine

## 2024-01-07 VITALS — BP 110/58 | HR 60 | Temp 98.1°F | Ht 69.0 in | Wt 159.0 lb

## 2024-01-07 DIAGNOSIS — E119 Type 2 diabetes mellitus without complications: Secondary | ICD-10-CM

## 2024-01-07 DIAGNOSIS — E1142 Type 2 diabetes mellitus with diabetic polyneuropathy: Secondary | ICD-10-CM

## 2024-01-07 DIAGNOSIS — Z0130 Encounter for examination of blood pressure without abnormal findings: Secondary | ICD-10-CM

## 2024-01-07 DIAGNOSIS — E782 Mixed hyperlipidemia: Secondary | ICD-10-CM

## 2024-01-07 LAB — POCT CBG (FASTING - GLUCOSE)-MANUAL ENTRY: Glucose Fasting, POC: 109 mg/dL — AB (ref 70–99)

## 2024-01-07 MED ORDER — AMLODIPINE BESY-BENAZEPRIL HCL 10-20 MG PO CAPS
1.0000 | ORAL_CAPSULE | Freq: Every morning | ORAL | 0 refills | Status: DC
Start: 2024-01-07 — End: 2024-03-20

## 2024-01-07 MED ORDER — METFORMIN HCL ER 500 MG PO TB24: 2000.0000 mg | ORAL_TABLET | Freq: Every day | ORAL | 0 refills | Status: DC

## 2024-01-07 MED ORDER — ACCU-CHEK GUIDE W/DEVICE KIT: 1.0000 | PACK | Freq: Every day | 0 refills | Status: DC

## 2024-01-07 MED ORDER — ACCU-CHEK GUIDE TEST VI STRP: ORAL_STRIP | 12 refills | Status: DC

## 2024-01-07 MED ORDER — ACCU-CHEK GUIDE W/DEVICE KIT: PACK | 0 refills | Status: DC

## 2024-01-07 MED ORDER — ATORVASTATIN CALCIUM 40 MG PO TABS: 40.0000 mg | ORAL_TABLET | Freq: Every day | ORAL | 0 refills | Status: DC

## 2024-01-07 MED ORDER — ACCU-CHEK SOFTCLIX LANCETS MISC: 12 refills | Status: AC

## 2024-01-07 MED ORDER — ACCU-CHEK FASTCLIX LANCET KIT: 1.0000 | PACK | Freq: Every day | 0 refills | Status: AC

## 2024-01-07 MED ORDER — PIOGLITAZONE HCL 30 MG PO TABS: 30.0000 mg | ORAL_TABLET | Freq: Every day | ORAL | 11 refills | Status: DC

## 2024-01-07 NOTE — Progress Notes (Signed)
 Established Patient Office Visit  Subjective:  Patient ID: Phillip Rodgers, male    DOB: Oct 21, 1961  Age: 63 y.o. MRN: [id number]  Chief Complaint  Patient presents with   Follow-up    Follow Up    No new complaints, here for lab review and medication refills. Labs reviewed and notable for uncontrolled diabetes, A1c not at target, lipids at target with unremarkable cmp. Denies any hypoglycemic episodes.    No other concerns at this time.   No past medical history on file.  No past surgical history on file.  Social History   Socioeconomic History   Marital status: Married    Spouse name: Not on file   Number of children: Not on file   Years of education: Not on file   Highest education level: Not on file  Occupational History   Not on file  Tobacco Use   Smoking status: Former    Current packs/day: 0.00    Types: Cigarettes    Start date: 63    Quit date: 2008    Years since quitting: 17.1   Smokeless tobacco: Never  Substance and Sexual Activity   Alcohol use: Never   Drug use: Never   Sexual activity: Yes  Other Topics Concern   Not on file  Social History Narrative   Not on file   Social Drivers of Health   Financial Resource Strain: Not on file  Food Insecurity: Not on file  Transportation Needs: Not on file  Physical Activity: Not on file  Stress: Not on file  Social Connections: Not on file  Intimate Partner Violence: Not on file    No family history on file.  No Known Allergies  Outpatient Medications Prior to Visit  Medication Sig   ascorbic acid (VITAMIN C) 500 MG tablet Take by mouth.   benazepril  (LOTENSIN ) 20 MG tablet TAKE 1 TABLET BY MOUTH DAILY   dutasteride  (AVODART ) 0.5 MG capsule Take 1 capsule (0.5 mg total) by mouth every morning.   fenofibrate  (TRICOR ) 145 MG tablet TAKE 1 TABLET BY MOUTH DAILY   fluticasone-salmeterol (ADVAIR HFA) 115-21 MCG/ACT inhaler Inhale into the lungs.   ketoconazole (NIZORAL) 2 % cream    mirabegron   ER (MYRBETRIQ ) 25 MG TB24 tablet Take 1 tablet (25 mg total) by mouth daily.   Multiple Vitamin (MULTI-VITAMIN) tablet Take 1 tablet by mouth daily.   pioglitazone  (ACTOS ) 15 MG tablet Take 1 tablet (15 mg total) by mouth every morning.   sildenafil  (REVATIO ) 20 MG tablet Take 2-5 tabs PRN for ED   tamsulosin  (FLOMAX ) 0.4 MG CAPS capsule Take 1 capsule (0.4 mg total) by mouth daily.   [DISCONTINUED] amLODipine -benazepril  (LOTREL) 10-20 MG capsule TAKE ONE CAPSULE BY MOUTH EVERY MORNING   [DISCONTINUED] atorvastatin  (LIPITOR) 40 MG tablet Take 1 tablet (40 mg total) by mouth at bedtime.   [DISCONTINUED] metFORMIN  (GLUCOPHAGE -XR) 500 MG 24 hr tablet Take 4 tablets (2,000 mg total) by mouth daily with breakfast.   No facility-administered medications prior to visit.    Review of Systems  Constitutional:  Negative for weight loss.  Respiratory: Negative.    Cardiovascular: Negative.   Neurological:  Positive for sensory change.       Numbness in the sole of his feet  Psychiatric/Behavioral: Negative.         Objective:   BP (!) 110/58   Pulse 60   Temp 98.1 F (36.7 C) (Tympanic)   Ht 5' 9 (1.753 m)   Wt 159 lb (72.1  kg)   SpO2 96%   BMI 23.48 kg/m   Vitals:   01/07/24 1512  BP: (!) 110/58  Pulse: 60  Temp: 98.1 F (36.7 C)  Height: 5' 9 (1.753 m)  Weight: 159 lb (72.1 kg)  SpO2: 96%  TempSrc: Tympanic  BMI (Calculated): 23.47    Physical Exam Vitals reviewed.  Constitutional:      Appearance: Normal appearance.  HENT:     Head: Normocephalic.     Left Ear: There is no impacted cerumen.     Nose: Nose normal.     Mouth/Throat:     Mouth: Mucous membranes are moist.     Pharynx: No posterior oropharyngeal erythema.  Eyes:     Extraocular Movements: Extraocular movements intact.     Pupils: Pupils are equal, round, and reactive to light.  Cardiovascular:     Rate and Rhythm: Regular rhythm.     Chest Wall: PMI is not displaced.     Pulses: Normal pulses.      Heart sounds: Normal heart sounds. No murmur heard. Pulmonary:     Effort: Pulmonary effort is normal.     Breath sounds: Normal air entry. No rhonchi or rales.  Abdominal:     General: Abdomen is flat. Bowel sounds are normal. There is no distension.     Palpations: Abdomen is soft. There is no hepatomegaly, splenomegaly or mass.     Tenderness: There is no abdominal tenderness.  Musculoskeletal:        General: Normal range of motion.     Cervical back: Normal range of motion and neck supple.     Right lower leg: No edema.     Left lower leg: No edema.  Skin:    General: Skin is warm and dry.  Neurological:     General: No focal deficit present.     Mental Status: He is alert and oriented to person, place, and time.     Cranial Nerves: No cranial nerve deficit.     Motor: No weakness.  Psychiatric:        Mood and Affect: Mood normal.        Behavior: Behavior normal.      Results for orders placed or performed in visit on 01/07/24  POCT CBG (Fasting - Glucose)  Result Value Ref Range   Glucose Fasting, POC 109 (A) 70 - 99 mg/dL    Recent Results (from the past 2160 hours)  Bladder Scan (Post Void Residual) in office     Status: None   Collection Time: 12/30/23 11:00 AM  Result Value Ref Range   Scan Result 23   Hemoglobin A1c     Status: Abnormal   Collection Time: 01/05/24  8:36 AM  Result Value Ref Range   Hgb A1c MFr Bld 7.2 (H) 4.8 - 5.6 %    Comment:          Prediabetes: 5.7 - 6.4          Diabetes: >6.4          Glycemic control for adults with diabetes: <7.0    Est. average glucose Bld gHb Est-mCnc 160 mg/dL  Lipid panel     Status: None   Collection Time: 01/05/24  8:36 AM  Result Value Ref Range   Cholesterol, Total 157 100 - 199 mg/dL   Triglycerides 90 0 - 149 mg/dL   HDL 63 >60 mg/dL   VLDL Cholesterol Cal 17 5 - 40 mg/dL   LDL Chol  Calc (NIH) 77 0 - 99 mg/dL   Chol/HDL Ratio 2.5 0.0 - 5.0 ratio    Comment:                                    T. Chol/HDL Ratio                                             Men  Women                               1/2 Avg.Risk  3.4    3.3                                   Avg.Risk  5.0    4.4                                2X Avg.Risk  9.6    7.1                                3X Avg.Risk 23.4   11.0   Comprehensive metabolic panel     Status: Abnormal   Collection Time: 01/05/24  8:36 AM  Result Value Ref Range   Glucose 134 (H) 70 - 99 mg/dL   BUN 22 8 - 27 mg/dL   Creatinine, Ser 9.11 0.76 - 1.27 mg/dL   eGFR 97 >40 fO/fpw/8.26   BUN/Creatinine Ratio 25 (H) 10 - 24   Sodium 140 134 - 144 mmol/L   Potassium 4.2 3.5 - 5.2 mmol/L   Chloride 103 96 - 106 mmol/L   CO2 22 20 - 29 mmol/L   Calcium  9.9 8.6 - 10.2 mg/dL   Total Protein 6.9 6.0 - 8.5 g/dL   Albumin 4.4 3.9 - 4.9 g/dL   Globulin, Total 2.5 1.5 - 4.5 g/dL   Bilirubin Total 0.5 0.0 - 1.2 mg/dL   Alkaline Phosphatase 70 44 - 121 IU/L   AST 15 0 - 40 IU/L   ALT 20 0 - 44 IU/L  CBC With Diff/Platelet     Status: Abnormal   Collection Time: 01/05/24  8:36 AM  Result Value Ref Range   WBC 5.8 3.4 - 10.8 x10E3/uL   RBC 4.43 4.14 - 5.80 x10E6/uL   Hemoglobin 12.5 (L) 13.0 - 17.7 g/dL   Hematocrit 60.7 62.4 - 51.0 %   MCV 89 79 - 97 fL   MCH 28.2 26.6 - 33.0 pg   MCHC 31.9 31.5 - 35.7 g/dL   RDW 85.9 88.3 - 84.5 %   Platelets 266 150 - 450 x10E3/uL   Neutrophils 53 Not Estab. %   Lymphs 33 Not Estab. %   Monocytes 9 Not Estab. %   Eos 4 Not Estab. %   Basos 1 Not Estab. %   Neutrophils Absolute 3.1 1.4 - 7.0 x10E3/uL   Lymphocytes Absolute 1.9 0.7 - 3.1 x10E3/uL   Monocytes Absolute 0.5 0.1 - 0.9 x10E3/uL   EOS (ABSOLUTE) 0.2 0.0 - 0.4 x10E3/uL   Basophils Absolute 0.1 0.0 - 0.2  x10E3/uL   Immature Granulocytes 0 Not Estab. %   Immature Grans (Abs) 0.0 0.0 - 0.1 x10E3/uL  POCT CBG (Fasting - Glucose)     Status: Abnormal   Collection Time: 01/07/24  3:15 PM  Result Value Ref Range   Glucose Fasting, POC 109 (A) 70 - 99  mg/dL      Assessment & Plan:  As per problem list and increase pioglitazone .  Problem List Items Addressed This Visit       Endocrine   Diabetic peripheral neuropathy associated with type 2 diabetes mellitus (HCC) - Primary   Relevant Medications   amLODipine -benazepril  (LOTREL) 10-20 MG capsule   atorvastatin  (LIPITOR) 40 MG tablet   pioglitazone  (ACTOS ) 30 MG tablet   metFORMIN  (GLUCOPHAGE -XR) 500 MG 24 hr tablet   Blood Glucose Monitoring Suppl (ACCU-CHEK GUIDE) w/Device KIT   Accu-Chek Softclix Lancets lancets   glucose blood (ACCU-CHEK GUIDE TEST) test strip   Lancets Misc. (ACCU-CHEK FASTCLIX LANCET) KIT   Other Relevant Orders   POCT CBG (Fasting - Glucose) (Completed)   Hemoglobin A1c     Other   Mixed hyperlipidemia   Relevant Medications   amLODipine -benazepril  (LOTREL) 10-20 MG capsule   atorvastatin  (LIPITOR) 40 MG tablet   Other Relevant Orders   Lipid panel   Hepatic function panel   Other Visit Diagnoses       Controlled type 2 diabetes mellitus without complication, without long-term current use of insulin (HCC)       Relevant Medications   amLODipine -benazepril  (LOTREL) 10-20 MG capsule   atorvastatin  (LIPITOR) 40 MG tablet   pioglitazone  (ACTOS ) 30 MG tablet   metFORMIN  (GLUCOPHAGE -XR) 500 MG 24 hr tablet       Return in about 3 months (around 04/05/2024) for fu with labs prior.   Total time spent: 20 minutes  Sherrill Cinderella Perry, MD  01/07/2024   This document may have been prepared by [facility] Voice Recognition software and as such may include unintentional dictation errors.

## 2024-01-14 MED ORDER — DUTASTERIDE 0.5 MG PO CAPS: 0.5000 mg | ORAL_CAPSULE | Freq: Every morning | ORAL | 1 refills | Status: DC

## 2024-01-14 MED ORDER — PIOGLITAZONE HCL 15 MG PO TABS: 15.0000 mg | ORAL_TABLET | Freq: Every morning | ORAL | 0 refills | Status: DC

## 2024-01-14 MED ORDER — EMPAGLIFLOZIN 25 MG PO TABS: 25.0000 mg | ORAL_TABLET | Freq: Every day | ORAL | 2 refills | Status: DC

## 2024-02-10 ENCOUNTER — Encounter: Payer: Self-pay | Admitting: Physician Assistant

## 2024-02-10 ENCOUNTER — Ambulatory Visit: Payer: Self-pay | Admitting: Physician Assistant

## 2024-02-10 VITALS — BP 120/69 | HR 69 | Ht 69.0 in | Wt 160.0 lb

## 2024-02-10 DIAGNOSIS — R3915 Urgency of urination: Secondary | ICD-10-CM | POA: Diagnosis not present

## 2024-02-10 DIAGNOSIS — N401 Enlarged prostate with lower urinary tract symptoms: Secondary | ICD-10-CM | POA: Diagnosis not present

## 2024-02-10 DIAGNOSIS — R3912 Poor urinary stream: Secondary | ICD-10-CM

## 2024-02-10 LAB — BLADDER SCAN AMB NON-IMAGING

## 2024-02-10 MED ORDER — TAMSULOSIN HCL 0.4 MG PO CAPS: 0.8000 mg | ORAL_CAPSULE | Freq: Every day | ORAL | 11 refills | Status: AC

## 2024-02-10 NOTE — Progress Notes (Signed)
 02/10/2024 11:43 AM   Phillip Rodgers November 20, 1961 [id number]  CC: Chief Complaint  Patient presents with   Follow-up   HPI: Phillip Rodgers is a 63 y.o. male with PMH BPH with LUTS on dutasteride who presents today for symptom recheck after resuming Flomax.   Today he reports some slight symptom improvement after resuming Flomax.  He was unable to resume Myrbetriq due to cost.  He is not yet at his treatment goal and describes persistent weak stream.  He denies orthostasis.  IPSS 18/unhappy as below, previously 26/unhappy.  PVR 6 mL.   IPSS     Row Name 02/10/24 1100         International Prostate Symptom Score   How often have you had the sensation of not emptying your bladder? Less than half the time     How often have you had to urinate less than every two hours? About half the time     How often have you found you stopped and started again several times when you urinated? Less than half the time     How often have you found it difficult to postpone urination? Less than half the time     How often have you had a weak urinary stream? About half the time     How often have you had to strain to start urination? About half the time     How many times did you typically get up at night to urinate? 3 Times     Total IPSS Score 18       Quality of Life due to urinary symptoms   If you were to spend the rest of your life with your urinary condition just the way it is now how would you feel about that? Unhappy               PMH: No past medical history on file.  Surgical History: No past surgical history on file.  Home Medications:  Allergies as of 02/10/2024   No Known Allergies      Medication List        Accurate as of February 10, 2024 11:43 AM. If you have any questions, ask your nurse or doctor.          STOP taking these medications    mirabegron ER 25 MG Tb24 tablet Commonly known as: MYRBETRIQ Stopped by: Carman Ching       TAKE these  medications    Accu-Chek Guide Test test strip Generic drug: glucose blood Use as instructed   Accu-Chek Guide w/Device Kit Use device to check sugar up to  three times daily   Accu-Chek Softclix Lancets lancets Use as instructed   amLODipine-benazepril 10-20 MG capsule Commonly known as: LOTREL Take 1 capsule by mouth every morning.   ascorbic acid 500 MG tablet Commonly known as: VITAMIN C Take by mouth.   atorvastatin 40 MG tablet Commonly known as: LIPITOR Take 1 tablet (40 mg total) by mouth at bedtime.   benazepril 20 MG tablet Commonly known as: LOTENSIN TAKE 1 TABLET BY MOUTH DAILY   dutasteride 0.5 MG capsule Commonly known as: AVODART Take 1 capsule (0.5 mg total) by mouth every morning.   empagliflozin 25 MG Tabs tablet Commonly known as: Jardiance Take 1 tablet (25 mg total) by mouth daily before breakfast.   fenofibrate 145 MG tablet Commonly known as: TRICOR TAKE 1 TABLET BY MOUTH DAILY   fluticasone-salmeterol 115-21 MCG/ACT inhaler Commonly known as: ADVAIR HFA Inhale  into the lungs.   ketoconazole 2 % cream Commonly known as: NIZORAL   metFORMIN 500 MG 24 hr tablet Commonly known as: GLUCOPHAGE-XR Take 4 tablets (2,000 mg total) by mouth daily with breakfast.   Multi-Vitamin tablet Take 1 tablet by mouth daily.   pioglitazone 30 MG tablet Commonly known as: Actos Take 1 tablet (30 mg total) by mouth daily.   pioglitazone 15 MG tablet Commonly known as: ACTOS Take 1 tablet (15 mg total) by mouth every morning.   sildenafil 20 MG tablet Commonly known as: REVATIO Take 2-5 tabs PRN for ED   tamsulosin 0.4 MG Caps capsule Commonly known as: FLOMAX Take 2 capsules (0.8 mg total) by mouth daily. What changed: how much to take Changed by: Carman Ching        Allergies:  No Known Allergies  Family History: No family history on file.  Social History:   reports that he quit smoking about 17 years ago. His smoking use  included cigarettes. He started smoking about 48 years ago. He has never used smokeless tobacco. He reports that he does not drink alcohol and does not use drugs.  Physical Exam: BP 120/69   Pulse 69   Ht 5\' 9"  (1.753 m)   Wt 160 lb (72.6 kg)   BMI 23.63 kg/m   Constitutional:  Alert and oriented, no acute distress, nontoxic appearing HEENT: [place], AT Cardiovascular: No clubbing, cyanosis, or edema Respiratory: Normal respiratory effort, no increased work of breathing Skin: No rashes, bruises or suspicious lesions Neurologic: Grossly intact, no focal deficits, moving all 4 extremities Psychiatric: Normal mood and affect  Laboratory Data: Results for orders placed or performed in visit on 02/10/24  BLADDER SCAN AMB NON-IMAGING   Collection Time: 02/10/24 11:10 AM  Result Value Ref Range   Scan Result 6ml    Assessment & Plan:   1. Benign prostatic hyperplasia with weak urinary stream (Primary) Slight symptom improvement after resuming Flomax, however not yet at treatment goal.  We discussed pursuing cystoscopy/TRUS for consideration of bladder outlet procedures versus increasing Flomax to maximize pharmacotherapy and he prefers to start with the latter.  We discussed continuing dutasteride and increasing Flomax to twice daily.  I will see him back for symptom recheck in 6 weeks.  If not yet at treatment goal, will pursue cystoscopy/TRUS. - BLADDER SCAN AMB NON-IMAGING - tamsulosin (FLOMAX) 0.4 MG CAPS capsule; Take 2 capsules (0.8 mg total) by mouth daily.  Dispense: 60 capsule; Refill: 11  Return in about 6 weeks (around 03/23/2024) for Symptom recheck, IPSS, PVR.  Carman Ching, PA-C  [facility] [place] [address] [place], [place] [address] [phone]

## 2024-02-18 ENCOUNTER — Telehealth: Payer: Self-pay | Admitting: Internal Medicine

## 2024-02-18 NOTE — Telephone Encounter (Signed)
 Dr Clent Ridges is leaving the practice and your New Patient or Transfer of Care appointment needs to be rescheduled with another provider. Please call the office to schedule a Transfer of Care to either Dr Charlann Lange, Darleen Crocker or Kara Dies, NP.   Thank you  E2C2, please reschedule this patient's new pt visit. [facility]

## 2024-02-21 ENCOUNTER — Other Ambulatory Visit: Payer: Self-pay

## 2024-02-21 ENCOUNTER — Other Ambulatory Visit: Payer: Self-pay | Admitting: Internal Medicine

## 2024-02-21 DIAGNOSIS — I10 Essential (primary) hypertension: Secondary | ICD-10-CM

## 2024-02-21 DIAGNOSIS — E119 Type 2 diabetes mellitus without complications: Secondary | ICD-10-CM

## 2024-02-21 DIAGNOSIS — E782 Mixed hyperlipidemia: Secondary | ICD-10-CM

## 2024-02-21 MED ORDER — SILDENAFIL CITRATE 20 MG PO TABS: 20.0000 mg | ORAL_TABLET | ORAL | 0 refills | Status: DC

## 2024-02-22 ENCOUNTER — Other Ambulatory Visit: Payer: Self-pay | Admitting: Cardiovascular Disease

## 2024-02-22 MED ORDER — SILDENAFIL CITRATE 20 MG PO TABS: 20.0000 mg | ORAL_TABLET | ORAL | 0 refills | Status: DC | PRN

## 2024-03-16 ENCOUNTER — Other Ambulatory Visit: Payer: Self-pay

## 2024-03-16 DIAGNOSIS — E1142 Type 2 diabetes mellitus with diabetic polyneuropathy: Secondary | ICD-10-CM

## 2024-03-16 DIAGNOSIS — E782 Mixed hyperlipidemia: Secondary | ICD-10-CM

## 2024-03-17 LAB — HEPATIC FUNCTION PANEL
ALT: 18 IU/L (ref 0–44)
AST: 16 IU/L (ref 0–40)
Albumin: 4.7 g/dL (ref 3.9–4.9)
Alkaline Phosphatase: 60 IU/L (ref 44–121)
Bilirubin Total: 0.7 mg/dL (ref 0.0–1.2)
Bilirubin, Direct: 0.24 mg/dL (ref 0.00–0.40)
Total Protein: 6.9 g/dL (ref 6.0–8.5)

## 2024-03-17 LAB — LIPID PANEL
Chol/HDL Ratio: 2.4 ratio (ref 0.0–5.0)
Cholesterol, Total: 161 mg/dL (ref 100–199)
HDL: 66 mg/dL (ref >39)
LDL Chol Calc (NIH): 77 mg/dL (ref 0–99)
Triglycerides: 102 mg/dL (ref 0–149)
VLDL Cholesterol Cal: 18 mg/dL (ref 5–40)

## 2024-03-17 LAB — HEMOGLOBIN A1C
Est. average glucose Bld gHb Est-mCnc: 137 mg/dL
Hgb A1c MFr Bld: 6.4 % — ABNORMAL HIGH (ref 4.8–5.6)

## 2024-03-20 ENCOUNTER — Ambulatory Visit: Payer: Self-pay | Admitting: Internal Medicine

## 2024-03-20 ENCOUNTER — Encounter: Payer: Self-pay | Admitting: Internal Medicine

## 2024-03-20 VITALS — BP 115/80 | HR 69 | Temp 97.0°F | Ht 69.0 in | Wt 160.8 lb

## 2024-03-20 DIAGNOSIS — E119 Type 2 diabetes mellitus without complications: Secondary | ICD-10-CM

## 2024-03-20 DIAGNOSIS — E782 Mixed hyperlipidemia: Secondary | ICD-10-CM

## 2024-03-20 DIAGNOSIS — N410 Acute prostatitis: Secondary | ICD-10-CM

## 2024-03-20 DIAGNOSIS — E1142 Type 2 diabetes mellitus with diabetic polyneuropathy: Secondary | ICD-10-CM

## 2024-03-20 DIAGNOSIS — I10 Essential (primary) hypertension: Secondary | ICD-10-CM

## 2024-03-20 LAB — GLUCOSE, POCT (MANUAL RESULT ENTRY): POC Glucose: 122 mg/dL — AB (ref 70–99)

## 2024-03-20 MED ORDER — METFORMIN HCL ER 500 MG PO TB24: 2000.0000 mg | ORAL_TABLET | Freq: Every day | ORAL | 0 refills | Status: DC

## 2024-03-20 MED ORDER — ATORVASTATIN CALCIUM 40 MG PO TABS: 40.0000 mg | ORAL_TABLET | Freq: Every day | ORAL | 0 refills | Status: DC

## 2024-03-20 MED ORDER — SILDENAFIL CITRATE 20 MG PO TABS: 20.0000 mg | ORAL_TABLET | ORAL | 0 refills | Status: DC | PRN

## 2024-03-20 MED ORDER — SULFAMETHOXAZOLE-TRIMETHOPRIM 800-160 MG PO TABS: 1.0000 | ORAL_TABLET | Freq: Two times a day (BID) | ORAL | 0 refills | Status: AC

## 2024-03-20 MED ORDER — FENOFIBRATE 145 MG PO TABS: 145.0000 mg | ORAL_TABLET | Freq: Every day | ORAL | 2 refills | Status: DC

## 2024-03-20 MED ORDER — AMLODIPINE BESY-BENAZEPRIL HCL 10-20 MG PO CAPS: 1.0000 | ORAL_CAPSULE | Freq: Every morning | ORAL | 0 refills | Status: DC

## 2024-03-20 MED ORDER — EMPAGLIFLOZIN 25 MG PO TABS: 25.0000 mg | ORAL_TABLET | Freq: Every day | ORAL | 2 refills | Status: AC

## 2024-03-20 NOTE — Progress Notes (Signed)
 Established Patient Office Visit  Subjective:  Patient ID: Phillip Rodgers, male    DOB: 08/17/1961  Age: 63 y.o. MRN: [id number]  Chief Complaint  Patient presents with   Follow-up    3 month follow up    No new complaints except anejaculation and abnormal urine color and odeor. Also here for lab review and medication refills. Labs reviewed and notable for well controlled diabetes, A1c now at target, lipids at target with unremarkable cmp. Denies any hypoglycemic episodes and home bg readings have been at target.     No other concerns at this time.   No past medical history on file.  No past surgical history on file.  Social History   Socioeconomic History   Marital status: Married    Spouse name: Not on file   Number of children: Not on file   Years of education: Not on file   Highest education level: Not on file  Occupational History   Not on file  Tobacco Use   Smoking status: Former    Current packs/day: 0.00    Types: Cigarettes    Start date: 40    Quit date: 2008    Years since quitting: 17.3   Smokeless tobacco: Never  Substance and Sexual Activity   Alcohol use: Never   Drug use: Never   Sexual activity: Yes  Other Topics Concern   Not on file  Social History Narrative   Not on file   Social Drivers of Health   Financial Resource Strain: Not on file  Food Insecurity: Not on file  Transportation Needs: Not on file  Physical Activity: Not on file  Stress: Not on file  Social Connections: Not on file  Intimate Partner Violence: Not on file    No family history on file.  No Known Allergies  Outpatient Medications Prior to Visit  Medication Sig   Accu-Chek Softclix Lancets lancets Use as instructed   ascorbic acid (VITAMIN C) 500 MG tablet Take by mouth.   Blood Glucose Monitoring Suppl (ACCU-CHEK GUIDE) w/Device KIT Use device to check sugar up to  three times daily   dutasteride  (AVODART ) 0.5 MG capsule Take 1 capsule (0.5 mg total) by mouth  every morning.   fluticasone-salmeterol (ADVAIR HFA) 115-21 MCG/ACT inhaler Inhale into the lungs.   glucose blood (ACCU-CHEK GUIDE TEST) test strip Use as instructed   ketoconazole (NIZORAL) 2 % cream    Multiple Vitamin (MULTI-VITAMIN) tablet Take 1 tablet by mouth daily.   pioglitazone  (ACTOS ) 15 MG tablet Take 1 tablet (15 mg total) by mouth every morning.   pioglitazone  (ACTOS ) 30 MG tablet Take 1 tablet (30 mg total) by mouth daily.   tamsulosin  (FLOMAX ) 0.4 MG CAPS capsule Take 2 capsules (0.8 mg total) by mouth daily.   [DISCONTINUED] amLODipine -benazepril  (LOTREL) 10-20 MG capsule Take 1 capsule by mouth every morning.   [DISCONTINUED] atorvastatin  (LIPITOR) 40 MG tablet Take 1 tablet (40 mg total) by mouth at bedtime.   [DISCONTINUED] benazepril  (LOTENSIN ) 20 MG tablet TAKE 1 TABLET BY MOUTH DAILY   [DISCONTINUED] fenofibrate  (TRICOR ) 145 MG tablet TAKE 1 TABLET BY MOUTH DAILY   [DISCONTINUED] metFORMIN  (GLUCOPHAGE -XR) 500 MG 24 hr tablet TAKE 4 TABLETS BY MOUTH DAILY WITH BREAKFAST   [DISCONTINUED] sildenafil  (REVATIO ) 20 MG tablet Take 1 tablet (20 mg total) by mouth as needed. Take 2-5 tabs by mouth  PRN for ED   [DISCONTINUED] empagliflozin  (JARDIANCE ) 25 MG TABS tablet Take 1 tablet (25 mg total) by mouth daily  before breakfast. (Patient not taking: Reported on 03/20/2024)   No facility-administered medications prior to visit.    Review of Systems  Constitutional:  Negative for weight loss.  Respiratory: Negative.    Cardiovascular: Negative.   Neurological:  Positive for sensory change.       Numbness in the sole of his feet  Psychiatric/Behavioral: Negative.         Objective:   BP 115/80   Pulse 69   Temp (!) 97 F (36.1 C)   Ht 5\' 9"  (1.753 m)   Wt 160 lb 12.8 oz (72.9 kg)   SpO2 95%   BMI 23.75 kg/m   Vitals:   03/20/24 1506  BP: 115/80  Pulse: 69  Temp: (!) 97 F (36.1 C)  Height: 5\' 9"  (1.753 m)  Weight: 160 lb 12.8 oz (72.9 kg)  SpO2: 95%  BMI  (Calculated): 23.74    Physical Exam Vitals reviewed.  Constitutional:      Appearance: Normal appearance.  HENT:     Head: Normocephalic.     Left Ear: There is no impacted cerumen.     Nose: Nose normal.     Mouth/Throat:     Mouth: Mucous membranes are moist.     Pharynx: No posterior oropharyngeal erythema.  Eyes:     Extraocular Movements: Extraocular movements intact.     Pupils: Pupils are equal, round, and reactive to light.  Cardiovascular:     Rate and Rhythm: Regular rhythm.     Chest Wall: PMI is not displaced.     Pulses: Normal pulses.     Heart sounds: Normal heart sounds. No murmur heard. Pulmonary:     Effort: Pulmonary effort is normal.     Breath sounds: Normal air entry. No rhonchi or rales.  Abdominal:     General: Abdomen is flat. Bowel sounds are normal. There is no distension.     Palpations: Abdomen is soft. There is no hepatomegaly, splenomegaly or mass.     Tenderness: There is no abdominal tenderness.  Musculoskeletal:        General: Normal range of motion.     Cervical back: Normal range of motion and neck supple.     Right lower leg: No edema.     Left lower leg: No edema.  Skin:    General: Skin is warm and dry.  Neurological:     General: No focal deficit present.     Mental Status: He is alert and oriented to person, place, and time.     Cranial Nerves: No cranial nerve deficit.     Motor: No weakness.  Psychiatric:        Mood and Affect: Mood normal.        Behavior: Behavior normal.      Results for orders placed or performed in visit on 03/20/24  POCT Glucose (CBG)  Result Value Ref Range   POC Glucose 122 (A) 70 - 99 mg/dl    Recent Results (from the past 2160 hours)  Bladder Scan (Post Void Residual) in office     Status: None   Collection Time: 12/30/23 11:00 AM  Result Value Ref Range   Scan Result 23   Hemoglobin A1c     Status: Abnormal   Collection Time: 01/05/24  8:36 AM  Result Value Ref Range   Hgb A1c MFr  Bld 7.2 (H) 4.8 - 5.6 %    Comment:          Prediabetes: 5.7 -  6.4          Diabetes: >6.4          Glycemic control for adults with diabetes: <7.0    Est. average glucose Bld gHb Est-mCnc 160 mg/dL  Lipid panel     Status: None   Collection Time: 01/05/24  8:36 AM  Result Value Ref Range   Cholesterol, Total 157 100 - 199 mg/dL   Triglycerides 90 0 - 149 mg/dL   HDL 63 >40 mg/dL   VLDL Cholesterol Cal 17 5 - 40 mg/dL   LDL Chol Calc (NIH) 77 0 - 99 mg/dL   Chol/HDL Ratio 2.5 0.0 - 5.0 ratio    Comment:                                   T. Chol/HDL Ratio                                             Men  Women                               1/2 Avg.Risk  3.4    3.3                                   Avg.Risk  5.0    4.4                                2X Avg.Risk  9.6    7.1                                3X Avg.Risk 23.4   11.0   Comprehensive metabolic panel     Status: Abnormal   Collection Time: 01/05/24  8:36 AM  Result Value Ref Range   Glucose 134 (H) 70 - 99 mg/dL   BUN 22 8 - 27 mg/dL   Creatinine, Ser 9.81 0.76 - 1.27 mg/dL   eGFR 97 >19 JY/NWG/9.56   BUN/Creatinine Ratio 25 (H) 10 - 24   Sodium 140 134 - 144 mmol/L   Potassium 4.2 3.5 - 5.2 mmol/L   Chloride 103 96 - 106 mmol/L   CO2 22 20 - 29 mmol/L   Calcium  9.9 8.6 - 10.2 mg/dL   Total Protein 6.9 6.0 - 8.5 g/dL   Albumin 4.4 3.9 - 4.9 g/dL   Globulin, Total 2.5 1.5 - 4.5 g/dL   Bilirubin Total 0.5 0.0 - 1.2 mg/dL   Alkaline Phosphatase 70 44 - 121 IU/L   AST 15 0 - 40 IU/L   ALT 20 0 - 44 IU/L  CBC With Diff/Platelet     Status: Abnormal   Collection Time: 01/05/24  8:36 AM  Result Value Ref Range   WBC 5.8 3.4 - 10.8 x10E3/uL   RBC 4.43 4.14 - 5.80 x10E6/uL   Hemoglobin 12.5 (L) 13.0 - 17.7 g/dL   Hematocrit 21.3 08.6 - 51.0 %   MCV 89 79 - 97 fL   MCH 28.2 26.6 - 33.0 pg   MCHC 31.9 31.5 -  35.7 g/dL   RDW 11.9 14.7 - 82.9 %   Platelets 266 150 - 450 x10E3/uL   Neutrophils 53 Not Estab. %   Lymphs  33 Not Estab. %   Monocytes 9 Not Estab. %   Eos 4 Not Estab. %   Basos 1 Not Estab. %   Neutrophils Absolute 3.1 1.4 - 7.0 x10E3/uL   Lymphocytes Absolute 1.9 0.7 - 3.1 x10E3/uL   Monocytes Absolute 0.5 0.1 - 0.9 x10E3/uL   EOS (ABSOLUTE) 0.2 0.0 - 0.4 x10E3/uL   Basophils Absolute 0.1 0.0 - 0.2 x10E3/uL   Immature Granulocytes 0 Not Estab. %   Immature Grans (Abs) 0.0 0.0 - 0.1 x10E3/uL  POCT CBG (Fasting - Glucose)     Status: Abnormal   Collection Time: 01/07/24  3:15 PM  Result Value Ref Range   Glucose Fasting, POC 109 (A) 70 - 99 mg/dL  BLADDER SCAN AMB NON-IMAGING     Status: None   Collection Time: 02/10/24 11:10 AM  Result Value Ref Range   Scan Result 6ml   Hepatic function panel     Status: None   Collection Time: 03/16/24 11:48 AM  Result Value Ref Range   Total Protein 6.9 6.0 - 8.5 g/dL   Albumin 4.7 3.9 - 4.9 g/dL   Bilirubin Total 0.7 0.0 - 1.2 mg/dL   Bilirubin, Direct 5.62 0.00 - 0.40 mg/dL   Alkaline Phosphatase 60 44 - 121 IU/L   AST 16 0 - 40 IU/L   ALT 18 0 - 44 IU/L  Lipid panel     Status: None   Collection Time: 03/16/24 11:48 AM  Result Value Ref Range   Cholesterol, Total 161 100 - 199 mg/dL   Triglycerides 130 0 - 149 mg/dL   HDL 66 >86 mg/dL   VLDL Cholesterol Cal 18 5 - 40 mg/dL   LDL Chol Calc (NIH) 77 0 - 99 mg/dL   Chol/HDL Ratio 2.4 0.0 - 5.0 ratio    Comment:                                   T. Chol/HDL Ratio                                             Men  Women                               1/2 Avg.Risk  3.4    3.3                                   Avg.Risk  5.0    4.4                                2X Avg.Risk  9.6    7.1                                3X Avg.Risk 23.4   11.0   Hemoglobin A1c     Status: Abnormal   Collection Time: 03/16/24 11:48 AM  Result  Value Ref Range   Hgb A1c MFr Bld 6.4 (H) 4.8 - 5.6 %    Comment:          Prediabetes: 5.7 - 6.4          Diabetes: >6.4          Glycemic control for adults with  diabetes: <7.0    Est. average glucose Bld gHb Est-mCnc 137 mg/dL  POCT Glucose (CBG)     Status: Abnormal   Collection Time: 03/20/24  3:13 PM  Result Value Ref Range   POC Glucose 122 (A) 70 - 99 mg/dl      Assessment & Plan:  As per problem list and f/u with Urology for anejaculation and possible prostatitis. Problem List Items Addressed This Visit       Cardiovascular and Mediastinum   Primary hypertension   Relevant Medications   sildenafil  (REVATIO ) 20 MG tablet   fenofibrate  (TRICOR ) 145 MG tablet   amLODipine -benazepril  (LOTREL) 10-20 MG capsule   atorvastatin  (LIPITOR) 40 MG tablet     Endocrine   Diabetic peripheral neuropathy associated with type 2 diabetes mellitus (HCC) - Primary   Relevant Medications   metFORMIN  (GLUCOPHAGE -XR) 500 MG 24 hr tablet   empagliflozin  (JARDIANCE ) 25 MG TABS tablet   amLODipine -benazepril  (LOTREL) 10-20 MG capsule   atorvastatin  (LIPITOR) 40 MG tablet   Other Relevant Orders   POCT Glucose (CBG) (Completed)     Other   Mixed hyperlipidemia   Relevant Medications   sildenafil  (REVATIO ) 20 MG tablet   fenofibrate  (TRICOR ) 145 MG tablet   amLODipine -benazepril  (LOTREL) 10-20 MG capsule   atorvastatin  (LIPITOR) 40 MG tablet   Other Visit Diagnoses       Acute prostatitis       Relevant Medications   sulfamethoxazole -trimethoprim  (BACTRIM  DS) 800-160 MG tablet     Controlled type 2 diabetes mellitus without complication, without long-term current use of insulin (HCC)       Relevant Medications   metFORMIN  (GLUCOPHAGE -XR) 500 MG 24 hr tablet   empagliflozin  (JARDIANCE ) 25 MG TABS tablet   amLODipine -benazepril  (LOTREL) 10-20 MG capsule   atorvastatin  (LIPITOR) 40 MG tablet       Return in about 3 months (around 06/19/2024) for fu with labs prior.   Total time spent: 30 minutes  Arzella Bitters, MD  03/20/2024   This document may have been prepared by [facility] Voice Recognition software and as such may include  unintentional dictation errors.

## 2024-03-23 ENCOUNTER — Ambulatory Visit: Admitting: Physician Assistant

## 2024-03-23 ENCOUNTER — Encounter: Payer: Self-pay | Admitting: Physician Assistant

## 2024-03-23 VITALS — BP 131/78 | HR 67 | Ht 68.5 in | Wt 156.0 lb

## 2024-03-23 DIAGNOSIS — R3912 Poor urinary stream: Secondary | ICD-10-CM

## 2024-03-23 DIAGNOSIS — R3915 Urgency of urination: Secondary | ICD-10-CM

## 2024-03-23 DIAGNOSIS — R310 Gross hematuria: Secondary | ICD-10-CM | POA: Diagnosis not present

## 2024-03-23 DIAGNOSIS — N401 Enlarged prostate with lower urinary tract symptoms: Secondary | ICD-10-CM

## 2024-03-23 LAB — URINALYSIS, COMPLETE
Bilirubin, UA: NEGATIVE
Glucose, UA: NEGATIVE
Ketones, UA: NEGATIVE
Leukocytes,UA: NEGATIVE
Nitrite, UA: NEGATIVE
Protein,UA: NEGATIVE
RBC, UA: NEGATIVE
Specific Gravity, UA: 1.025 (ref 1.005–1.030)
Urobilinogen, Ur: 0.2 mg/dL (ref 0.2–1.0)
pH, UA: 5.5 (ref 5.0–7.5)

## 2024-03-23 LAB — MICROSCOPIC EXAMINATION

## 2024-03-23 LAB — BLADDER SCAN AMB NON-IMAGING

## 2024-03-23 NOTE — Progress Notes (Signed)
 03/23/2024 10:21 AM   Phillip Rodgers [name] [birth year] [id number]  CC: Chief Complaint  Patient presents with   Follow-up   HPI: Phillip Rodgers is a 63 y.o. male with PMH BPH with LUTS on dutasteride  who presents today for symptom recheck after increasing Flomax  to 0.8 mg daily.   Today he reports slight improvement in his voiding symptoms after increasing Flomax  to twice daily.  He has been noticing some retrograde ejaculation, but his ejaculatory sensation is unaffected.  He reports an episode of pink painless gross hematuria that occurred about 10 days ago but has since cleared.  No other episodes of gross hematuria.  He has a history of remote nephrolithiasis several decades ago.  IPSS 17/mostly dissatisfied as below.  PVR 0 mL.   IPSS     Row Name 03/23/24 0900         International Prostate Symptom Score   How often have you had the sensation of not emptying your bladder? Less than half the time     How often have you had to urinate less than every two hours? Less than half the time     How often have you found you stopped and started again several times when you urinated? Less than half the time     How often have you found it difficult to postpone urination? Less than 1 in 5 times     How often have you had a weak urinary stream? About half the time     How often have you had to strain to start urination? About half the time     How many times did you typically get up at night to urinate? 4 Times     Total IPSS Score 17       Quality of Life due to urinary symptoms   If you were to spend the rest of your life with your urinary condition just the way it is now how would you feel about that? Mostly Disatisfied               PMH: No past medical history on file.  Surgical History: No past surgical history on file.  Home Medications:  Allergies as of 03/23/2024   No Known Allergies      Medication List        Accurate as of March 23, 2024 10:21 AM. If you have  any questions, ask your nurse or doctor.          STOP taking these medications    fluticasone-salmeterol 115-21 MCG/ACT inhaler Commonly known as: ADVAIR HFA Stopped by: Kathreen Pare       TAKE these medications    Accu-Chek Guide Test test strip Generic drug: glucose blood Use as instructed   Accu-Chek Guide w/Device Kit Use device to check sugar up to  three times daily   Accu-Chek Softclix Lancets lancets Use as instructed   amLODipine -benazepril  10-20 MG capsule Commonly known as: LOTREL Take 1 capsule by mouth every morning.   ascorbic acid 500 MG tablet Commonly known as: VITAMIN C Take by mouth.   atorvastatin  40 MG tablet Commonly known as: LIPITOR Take 1 tablet (40 mg total) by mouth at bedtime.   dutasteride  0.5 MG capsule Commonly known as: AVODART  Take 1 capsule (0.5 mg total) by mouth every morning.   empagliflozin  25 MG Tabs tablet Commonly known as: Jardiance  Take 1 tablet (25 mg total) by mouth daily before breakfast.   fenofibrate  145 MG tablet Commonly known as: TRICOR   Take 1 tablet (145 mg total) by mouth daily.   ketoconazole 2 % cream Commonly known as: NIZORAL   metFORMIN  500 MG 24 hr tablet Commonly known as: GLUCOPHAGE -XR Take 4 tablets (2,000 mg total) by mouth daily with breakfast.   Multi-Vitamin tablet Take 1 tablet by mouth daily.   pioglitazone  30 MG tablet Commonly known as: Actos  Take 1 tablet (30 mg total) by mouth daily.   pioglitazone  15 MG tablet Commonly known as: ACTOS  Take 1 tablet (15 mg total) by mouth every morning.   sildenafil  20 MG tablet Commonly known as: REVATIO  Take 1 tablet (20 mg total) by mouth as needed. Take 2-5 tabs by mouth  PRN for ED   sulfamethoxazole -trimethoprim  800-160 MG tablet Commonly known as: Bactrim  DS Take 1 tablet by mouth 2 (two) times daily for 14 days.   tamsulosin  0.4 MG Caps capsule Commonly known as: FLOMAX  Take 2 capsules (0.8 mg total) by mouth  daily.        Allergies:  No Known Allergies  Family History: No family history on file.  Social History:   reports that he quit smoking about 17 years ago. His smoking use included cigarettes. He started smoking about 48 years ago. He has never used smokeless tobacco. He reports that he does not drink alcohol and does not use drugs.  Physical Exam: BP 131/78   Pulse 67   Ht 5' 8.5" (1.74 m)   Wt 156 lb (70.8 kg)   BMI 23.37 kg/m   Constitutional:  Alert and oriented, no acute distress, nontoxic appearing HEENT: [place], AT Cardiovascular: No clubbing, cyanosis, or edema Respiratory: Normal respiratory effort, no increased work of breathing Skin: No rashes, bruises or suspicious lesions Neurologic: Grossly intact, no focal deficits, moving all 4 extremities Psychiatric: Normal mood and affect  Laboratory Data: Results for orders placed or performed in visit on 03/23/24  BLADDER SCAN AMB NON-IMAGING   Collection Time: 03/23/24  9:55 AM  Result Value Ref Range   Scan Result 0ml    Assessment & Plan:   1. Benign prostatic hyperplasia with weak urinary stream (Primary) Slight improvement in LUTS on Flomax  0.8 mg daily.  He is emptying appropriately.  He continues to have moderate symptoms per IPSS and I recommend cystoscopy for further evaluation. - BLADDER SCAN AMB NON-IMAGING  2. Gross hematuria Subjective reports of gross hematuria.  I asked him to leave a UA on the way out today.  Regardless, I recommended hematuria workup including CT urogram and cystoscopy as above.  He is in agreement. - Urinalysis, Complete - CT HEMATURIA WORKUP; Future   Return in about 4 weeks (around 04/20/2024) for Cysto and CTU results.  Kathreen Pare, PA-C  [facility] [place] [address], [address] [place], [place] [address] [phone]

## 2024-03-23 NOTE — Patient Instructions (Signed)

## 2024-04-01 ENCOUNTER — Other Ambulatory Visit: Payer: Self-pay | Admitting: Internal Medicine

## 2024-04-01 DIAGNOSIS — I10 Essential (primary) hypertension: Secondary | ICD-10-CM

## 2024-04-20 ENCOUNTER — Telehealth: Payer: Self-pay | Admitting: Urology

## 2024-04-20 ENCOUNTER — Ambulatory Visit: Payer: Self-pay | Admitting: Family Medicine

## 2024-04-20 NOTE — Telephone Encounter (Signed)
 Patient requests phone call to discuss cystoscopy procedure he is scheduled for on 05/03/24. He has some questions and concerns.

## 2024-04-20 NOTE — Telephone Encounter (Signed)
 Talked with patient about cysto , Patient understands procedure .

## 2024-04-25 ENCOUNTER — Ambulatory Visit: Payer: Self-pay | Admitting: Internal Medicine

## 2024-04-28 ENCOUNTER — Ambulatory Visit: Payer: Self-pay | Admitting: Internal Medicine

## 2024-05-02 ENCOUNTER — Other Ambulatory Visit: Payer: Self-pay | Admitting: Internal Medicine

## 2024-05-02 ENCOUNTER — Encounter: Payer: Self-pay | Admitting: *Deleted

## 2024-05-02 DIAGNOSIS — E782 Mixed hyperlipidemia: Secondary | ICD-10-CM

## 2024-05-03 ENCOUNTER — Other Ambulatory Visit: Admitting: Urology

## 2024-05-10 ENCOUNTER — Ambulatory Visit
Admission: RE | Admit: 2024-05-10 | Discharge: 2024-05-10 | Disposition: A | Discharge status: Home / Self Care | Source: Ambulatory Visit | Attending: Physician Assistant | Admitting: Physician Assistant

## 2024-05-10 DIAGNOSIS — R310 Gross hematuria: Secondary | ICD-10-CM | POA: Insufficient documentation

## 2024-05-10 MED ORDER — IOHEXOL 300 MG/ML  SOLN
100.0000 mL | Freq: Once | INTRAMUSCULAR | Status: AC | PRN
  Administered 2024-05-10: 100 mL via INTRAVENOUS

## 2024-05-15 ENCOUNTER — Ambulatory Visit: Payer: Self-pay | Admitting: Physician Assistant

## 2024-05-16 ENCOUNTER — Telehealth: Payer: Self-pay | Admitting: Urology

## 2024-05-16 NOTE — Telephone Encounter (Signed)
 Called patient and lvm to call office to schedule Cysto and CTU results with Dr. Cherylene Corrente per Glee Lana.

## 2024-05-24 ENCOUNTER — Telehealth: Payer: Self-pay | Admitting: Internal Medicine

## 2024-05-24 NOTE — Telephone Encounter (Signed)
 Patient left VM that he needs refills on all of his meds. [place].

## 2024-05-26 NOTE — Telephone Encounter (Signed)
LM for pt to call back and sent my chart message.

## 2024-07-03 ENCOUNTER — Other Ambulatory Visit: Admitting: Urology

## 2024-07-04 ENCOUNTER — Ambulatory Visit: Payer: Self-pay | Admitting: Family Medicine

## 2024-07-11 ENCOUNTER — Telehealth: Payer: Self-pay

## 2024-07-11 NOTE — Telephone Encounter (Signed)
 Paitent LM asking for refills, please call the patient to find out what he needs to have sent to the pharmacy

## 2024-07-18 ENCOUNTER — Ambulatory Visit: Admitting: Urology

## 2024-07-18 ENCOUNTER — Encounter: Payer: Self-pay | Admitting: Urology

## 2024-07-18 ENCOUNTER — Ambulatory Visit: Payer: Self-pay | Admitting: Internal Medicine

## 2024-07-18 VITALS — BP 129/79 | HR 77 | Ht 69.0 in | Wt 156.0 lb

## 2024-07-18 DIAGNOSIS — R310 Gross hematuria: Secondary | ICD-10-CM

## 2024-07-18 DIAGNOSIS — R3915 Urgency of urination: Secondary | ICD-10-CM

## 2024-07-18 DIAGNOSIS — N401 Enlarged prostate with lower urinary tract symptoms: Secondary | ICD-10-CM

## 2024-07-18 LAB — URINALYSIS, COMPLETE
Bilirubin, UA: NEGATIVE
Glucose, UA: NEGATIVE
Ketones, UA: NEGATIVE
Leukocytes,UA: NEGATIVE
Nitrite, UA: NEGATIVE
Protein,UA: NEGATIVE
Specific Gravity, UA: 1.01 (ref 1.005–1.030)
Urobilinogen, Ur: 0.2 mg/dL (ref 0.2–1.0)
pH, UA: 6 (ref 5.0–7.5)

## 2024-07-18 LAB — MICROSCOPIC EXAMINATION: RBC, Urine: >30 /HPF — AB (ref 0–2)

## 2024-07-18 NOTE — Progress Notes (Signed)
   07/18/24  CC:  Chief Complaint  Patient presents with   Cysto    HPI: Refer to Phillip Rodgers's prior note 03/19/24.  CT urogram performed 05/10/2024 showed a 2 mm nonobstructing right lower pole calculus and 4 left lower pole renal calculi, largest measuring 4 mm.  No enhancing masses, hydronephrosis or ureteral abnormalities.  UA today dipstick 3+ blood/microscopy >30 RBC  Blood pressure 129/79, pulse 77, height 5' 9 (1.753 m), weight 156 lb (70.8 kg). NED. A&Ox3.   No respiratory distress   Abd soft, NT, ND Normal phallus with bilateral descended testicles  Cystoscopy Procedure Note  Patient identification was confirmed, informed consent was obtained, and patient was prepped using Betadine solution.  Lidocaine jelly was administered per urethral meatus.     Pre-Procedure: - Inspection reveals a normal caliber urethral meatus.  Procedure: The flexible cystoscope was introduced without difficulty - No urethral strictures/lesions are present. - Moderate lateral lobe enlargement prostate  - Elevated bladder neck - Bilateral ureteral orifices identified - Bladder mucosa  reveals no ulcers, tumors, or lesions -Small stone present in bladder  -Moderate trabeculation  Retroflexion shows no intravesical median lobe or lesions  The small stone was grasped with endoscopic forceps and removed without difficulty   Post-Procedure: - Patient tolerated the procedure well  Assessment/ Plan: No bladder mucosal lesions Small bladder stone-removed Bilateral nephrolithiasis on CT urogram Recommend 23-month follow-up with KUB/UA    Phillip JAYSON Barba, MD

## 2024-07-19 ENCOUNTER — Ambulatory Visit: Payer: Self-pay | Admitting: Internal Medicine

## 2024-08-02 ENCOUNTER — Ambulatory Visit: Payer: Self-pay | Admitting: Internal Medicine

## 2024-08-23 ENCOUNTER — Other Ambulatory Visit: Payer: Self-pay

## 2024-08-23 DIAGNOSIS — E782 Mixed hyperlipidemia: Secondary | ICD-10-CM

## 2024-08-23 DIAGNOSIS — E1142 Type 2 diabetes mellitus with diabetic polyneuropathy: Secondary | ICD-10-CM

## 2024-08-24 LAB — HEMOGLOBIN A1C
Est. average glucose Bld gHb Est-mCnc: 146 mg/dL
Hgb A1c MFr Bld: 6.7 % — ABNORMAL HIGH (ref 4.8–5.6)

## 2024-08-24 LAB — LIPID PANEL
Chol/HDL Ratio: 2.3 ratio (ref 0.0–5.0)
Cholesterol, Total: 179 mg/dL (ref 100–199)
HDL: 79 mg/dL (ref >39)
LDL Chol Calc (NIH): 81 mg/dL (ref 0–99)
Triglycerides: 106 mg/dL (ref 0–149)
VLDL Cholesterol Cal: 19 mg/dL (ref 5–40)

## 2024-08-25 ENCOUNTER — Ambulatory Visit: Payer: Self-pay | Admitting: Internal Medicine

## 2024-08-25 VITALS — BP 120/68 | HR 80 | Temp 97.2°F | Ht 68.5 in | Wt 162.0 lb

## 2024-08-25 DIAGNOSIS — R351 Nocturia: Secondary | ICD-10-CM

## 2024-08-25 DIAGNOSIS — E119 Type 2 diabetes mellitus without complications: Secondary | ICD-10-CM

## 2024-08-25 DIAGNOSIS — N401 Enlarged prostate with lower urinary tract symptoms: Secondary | ICD-10-CM

## 2024-08-25 DIAGNOSIS — E782 Mixed hyperlipidemia: Secondary | ICD-10-CM

## 2024-08-25 DIAGNOSIS — I10 Essential (primary) hypertension: Secondary | ICD-10-CM

## 2024-08-25 DIAGNOSIS — E1142 Type 2 diabetes mellitus with diabetic polyneuropathy: Secondary | ICD-10-CM

## 2024-08-25 LAB — POCT CBG (FASTING - GLUCOSE)-MANUAL ENTRY: Glucose Fasting, POC: 129 mg/dL — AB (ref 70–99)

## 2024-08-25 MED ORDER — ATORVASTATIN CALCIUM 40 MG PO TABS: 40.0000 mg | ORAL_TABLET | Freq: Every day | ORAL | 0 refills | Status: DC

## 2024-08-25 MED ORDER — METFORMIN HCL ER 500 MG PO TB24: 2000.0000 mg | ORAL_TABLET | Freq: Every day | ORAL | 0 refills | Status: DC

## 2024-08-25 NOTE — Progress Notes (Signed)
 Established Patient Office Visit  Subjective:  Patient ID: Phillip Rodgers, male    DOB: 1961/01/04  Age: 63 y.o. MRN: [id number]  Chief Complaint  Patient presents with   Follow-up    4 months follow up    No new complaints, here for lab review and medication refills. Labs reviewed and notable for well controlled diabetes, A1c higher but remains at target, lipids at target. Denies any hypoglycemic episodes and home bg readings have been at target.     No other concerns at this time.   No past medical history on file.  No past surgical history on file.  Social History   Socioeconomic History   Marital status: Married    Spouse name: Not on file   Number of children: Not on file   Years of education: Not on file   Highest education level: Not on file  Occupational History   Not on file  Tobacco Use   Smoking status: Former    Current packs/day: 0.00    Types: Cigarettes    Start date: 61    Quit date: 2008    Years since quitting: 17.7   Smokeless tobacco: Never  Substance and Sexual Activity   Alcohol use: Never   Drug use: Never   Sexual activity: Yes  Other Topics Concern   Not on file  Social History Narrative   Not on file   Social Drivers of Health   Financial Resource Strain: Not on file  Food Insecurity: Not on file  Transportation Needs: Not on file  Physical Activity: Not on file  Stress: Not on file  Social Connections: Not on file  Intimate Partner Violence: Not on file    No family history on file.  No Known Allergies  Outpatient Medications Prior to Visit  Medication Sig   Accu-Chek Softclix Lancets lancets Use as instructed   amLODipine  (NORVASC ) 10 MG tablet TAKE 1 TABLET BY MOUTH DAILY   ascorbic acid (VITAMIN C) 500 MG tablet Take by mouth.   Blood Glucose Monitoring Suppl (ACCU-CHEK GUIDE) w/Device KIT Use device to check sugar up to  three times daily   dutasteride  (AVODART ) 0.5 MG capsule Take 1 capsule (0.5 mg total) by mouth  every morning.   fenofibrate  (TRICOR ) 145 MG tablet TAKE 1 TABLET BY MOUTH DAILY   glucose blood (ACCU-CHEK GUIDE TEST) test strip Use as instructed   ketoconazole (NIZORAL) 2 % cream    Multiple Vitamin (MULTI-VITAMIN) tablet Take 1 tablet by mouth daily.   pioglitazone  (ACTOS ) 30 MG tablet Take 1 tablet (30 mg total) by mouth daily.   sildenafil  (REVATIO ) 20 MG tablet Take 1 tablet (20 mg total) by mouth as needed. Take 2-5 tabs by mouth  PRN for ED   tamsulosin  (FLOMAX ) 0.4 MG CAPS capsule Take 2 capsules (0.8 mg total) by mouth daily.   [DISCONTINUED] atorvastatin  (LIPITOR) 40 MG tablet Take 1 tablet (40 mg total) by mouth at bedtime.   [DISCONTINUED] metFORMIN  (GLUCOPHAGE -XR) 500 MG 24 hr tablet Take 4 tablets (2,000 mg total) by mouth daily with breakfast.   [DISCONTINUED] pioglitazone  (ACTOS ) 15 MG tablet Take 1 tablet (15 mg total) by mouth every morning.   No facility-administered medications prior to visit.    Review of Systems  Constitutional:  Negative for weight loss (gained 6 lbs).  Respiratory: Negative.    Cardiovascular: Negative.   Neurological:  Positive for sensory change.       Numbness in the sole of his feet  Psychiatric/Behavioral: Negative.         Objective:   BP 120/68   Pulse 80   Temp (!) 97.2 F (36.2 C) (Tympanic)   Ht 5' 8.5 (1.74 m)   Wt 162 lb (73.5 kg)   SpO2 95%   BMI 24.27 kg/m   Vitals:   08/25/24 1109  BP: 120/68  Pulse: 80  Temp: (!) 97.2 F (36.2 C)  Height: 5' 8.5 (1.74 m)  Weight: 162 lb (73.5 kg)  SpO2: 95%  TempSrc: Tympanic  BMI (Calculated): 24.27    Physical Exam Vitals reviewed.  Constitutional:      Appearance: Normal appearance.  HENT:     Head: Normocephalic.     Left Ear: There is no impacted cerumen.     Nose: Nose normal.     Mouth/Throat:     Mouth: Mucous membranes are moist.     Pharynx: No posterior oropharyngeal erythema.  Eyes:     Extraocular Movements: Extraocular movements intact.      Pupils: Pupils are equal, round, and reactive to light.  Cardiovascular:     Rate and Rhythm: Regular rhythm.     Chest Wall: PMI is not displaced.     Pulses: Normal pulses.     Heart sounds: Normal heart sounds. No murmur heard. Pulmonary:     Effort: Pulmonary effort is normal.     Breath sounds: Normal air entry. No rhonchi or rales.  Abdominal:     General: Abdomen is flat. Bowel sounds are normal. There is no distension.     Palpations: Abdomen is soft. There is no hepatomegaly, splenomegaly or mass.     Tenderness: There is no abdominal tenderness.  Musculoskeletal:        General: Normal range of motion.     Cervical back: Normal range of motion and neck supple.     Right lower leg: No edema.     Left lower leg: No edema.  Skin:    General: Skin is warm and dry.  Neurological:     General: No focal deficit present.     Mental Status: He is alert and oriented to person, place, and time.     Cranial Nerves: No cranial nerve deficit.     Motor: No weakness.  Psychiatric:        Mood and Affect: Mood normal.        Behavior: Behavior normal.      Results for orders placed or performed in visit on 08/25/24  POCT CBG (Fasting - Glucose)  Result Value Ref Range   Glucose Fasting, POC 129 (A) 70 - 99 mg/dL    Recent Results (from the past 2160 hours)  Urinalysis, Complete     Status: Abnormal   Collection Time: 07/18/24 12:58 PM  Result Value Ref Range   Specific Gravity, UA 1.010 1.005 - 1.030   pH, UA 6.0 5.0 - 7.5   Color, UA Yellow Yellow   Appearance Ur Clear Clear   Leukocytes,UA Negative Negative   Protein,UA Negative Negative/Trace   Glucose, UA Negative Negative   Ketones, UA Negative Negative   RBC, UA 3+ (A) Negative   Bilirubin, UA Negative Negative   Urobilinogen, Ur 0.2 0.2 - 1.0 mg/dL   Nitrite, UA Negative Negative   Microscopic Examination See below:     Comment: Microscopic was indicated and was performed.  Microscopic Examination     Status:  Abnormal   Collection Time: 07/18/24 12:58 PM   Urine  Result  Value Ref Range   WBC, UA 0-5 0 - 5 /hpf   RBC, Urine >30 (A) 0 - 2 /hpf   Epithelial Cells (non renal) 0-10 0 - 10 /hpf   Bacteria, UA Few None seen/Few  Lipid panel     Status: None   Collection Time: 08/23/24 11:27 AM  Result Value Ref Range   Cholesterol, Total 179 100 - 199 mg/dL   Triglycerides 893 0 - 149 mg/dL   HDL 79 >60 mg/dL   VLDL Cholesterol Cal 19 5 - 40 mg/dL   LDL Chol Calc (NIH) 81 0 - 99 mg/dL   Chol/HDL Ratio 2.3 0.0 - 5.0 ratio    Comment:                                   T. Chol/HDL Ratio                                             Men  Women                               1/2 Avg.Risk  3.4    3.3                                   Avg.Risk  5.0    4.4                                2X Avg.Risk  9.6    7.1                                3X Avg.Risk 23.4   11.0   Hemoglobin A1c     Status: Abnormal   Collection Time: 08/23/24 11:27 AM  Result Value Ref Range   Hgb A1c MFr Bld 6.7 (H) 4.8 - 5.6 %    Comment:          Prediabetes: 5.7 - 6.4          Diabetes: >6.4          Glycemic control for adults with diabetes: <7.0    Est. average glucose Bld gHb Est-mCnc 146 mg/dL  POCT CBG (Fasting - Glucose)     Status: Abnormal   Collection Time: 08/25/24 11:25 AM  Result Value Ref Range   Glucose Fasting, POC 129 (A) 70 - 99 mg/dL      Assessment & Plan:  Phillip Rodgers was seen today for follow-up.  Controlled type 2 diabetes mellitus without complication, without long-term current use of insulin (HCC) -     POCT CBG (Fasting - Glucose) -     metFORMIN  HCl ER; Take 4 tablets (2,000 mg total) by mouth daily with breakfast.  Dispense: 360 tablet; Refill: 0 -     Hemoglobin A1c; Standing  Mixed hyperlipidemia -     Atorvastatin  Calcium ; Take 1 tablet (40 mg total) by mouth at bedtime.  Dispense: 90 tablet; Refill: 0  BPH associated with nocturia  Primary hypertension -     Comprehensive metabolic  panel with GFR -  CBC With Diff/Platelet  Diabetic peripheral neuropathy associated with type 2 diabetes mellitus (HCC) -     Ambulatory referral to Podiatry    Problem List Items Addressed This Visit       Cardiovascular and Mediastinum   Primary hypertension   Relevant Medications   atorvastatin  (LIPITOR) 40 MG tablet   Other Relevant Orders   Comprehensive metabolic panel with GFR   CBC With Diff/Platelet     Endocrine   Diabetic peripheral neuropathy associated with type 2 diabetes mellitus (HCC)   Relevant Medications   atorvastatin  (LIPITOR) 40 MG tablet   metFORMIN  (GLUCOPHAGE -XR) 500 MG 24 hr tablet   Other Relevant Orders   Ambulatory referral to Podiatry     Other   BPH associated with nocturia   Mixed hyperlipidemia   Relevant Medications   atorvastatin  (LIPITOR) 40 MG tablet   Other Visit Diagnoses       Controlled type 2 diabetes mellitus without complication, without long-term current use of insulin (HCC)    -  Primary   Relevant Medications   atorvastatin  (LIPITOR) 40 MG tablet   metFORMIN  (GLUCOPHAGE -XR) 500 MG 24 hr tablet   Other Relevant Orders   POCT CBG (Fasting - Glucose) (Completed)   Hemoglobin A1c       Return in about 3 months (around 11/24/2024) for cpe with labs prior.   Total time spent: 20 minutes  Sherrill Cinderella Perry, MD  08/25/2024   This document may have been prepared by [facility] Voice Recognition software and as such may include unintentional dictation errors.

## 2024-08-28 ENCOUNTER — Encounter: Payer: Self-pay | Admitting: Internal Medicine

## 2024-09-18 ENCOUNTER — Telehealth: Payer: Self-pay

## 2024-09-18 NOTE — Telephone Encounter (Signed)
 PA has been sent to [facility] cover my meds. Awaiting approval.

## 2024-09-19 ENCOUNTER — Other Ambulatory Visit: Payer: Self-pay | Admitting: Internal Medicine

## 2024-09-20 NOTE — Telephone Encounter (Signed)
 [facility] has denied PA for Mirabegron  ER 25 MG Tab due to medical necessity have not been met.  -Trial and failure of behavioral therapies (such as bladder training, pelvic muscle training, or adjusting the amount of fluid consumed) -Trial and failure for at least one month Darifenacin, Fesoterodine, Oxybutynin, solifenacin, Tolterodine, Trospium.

## 2024-10-11 ENCOUNTER — Ambulatory Visit: Admitting: Nurse Practitioner

## 2024-10-24 ENCOUNTER — Ambulatory Visit: Admitting: Nurse Practitioner

## 2024-10-24 ENCOUNTER — Encounter: Payer: Self-pay | Admitting: Nurse Practitioner

## 2024-10-24 VITALS — BP 118/64 | HR 64 | Temp 98.1°F | Ht 68.0 in | Wt 156.8 lb

## 2024-10-24 DIAGNOSIS — E782 Mixed hyperlipidemia: Secondary | ICD-10-CM

## 2024-10-24 DIAGNOSIS — E1142 Type 2 diabetes mellitus with diabetic polyneuropathy: Secondary | ICD-10-CM | POA: Diagnosis not present

## 2024-10-24 DIAGNOSIS — Z7984 Long term (current) use of oral hypoglycemic drugs: Secondary | ICD-10-CM

## 2024-10-24 DIAGNOSIS — N401 Enlarged prostate with lower urinary tract symptoms: Secondary | ICD-10-CM

## 2024-10-24 DIAGNOSIS — Z0000 Encounter for general adult medical examination without abnormal findings: Secondary | ICD-10-CM

## 2024-10-24 DIAGNOSIS — Z1211 Encounter for screening for malignant neoplasm of colon: Secondary | ICD-10-CM

## 2024-10-24 DIAGNOSIS — Z23 Encounter for immunization: Secondary | ICD-10-CM | POA: Diagnosis not present

## 2024-10-24 DIAGNOSIS — I10 Essential (primary) hypertension: Secondary | ICD-10-CM | POA: Diagnosis not present

## 2024-10-24 DIAGNOSIS — R351 Nocturia: Secondary | ICD-10-CM

## 2024-10-24 DIAGNOSIS — Z122 Encounter for screening for malignant neoplasm of respiratory organs: Secondary | ICD-10-CM

## 2024-10-24 NOTE — Assessment & Plan Note (Addendum)
 Patient maintained on amlodipine  10 mg daily.  Question additional agent as patient states he is on a combination amlodipine  pill we will verify with pharmacy once they are open.  Continue taking prescribed medications  Called pharmacy he is taking amlodipine  10mg  benazepril  20mg  daily

## 2024-10-24 NOTE — Assessment & Plan Note (Signed)
 Patient currently maintained on metformin  2000 mg daily and pioglitazone  30 mg daily.  Last A1c well-controlled at 6.7%.  Patient does not check glucose at home but has supplies if needed.  Denies hypoglycemia continue medications as prescribed

## 2024-10-24 NOTE — Patient Instructions (Signed)
 Nice to see you today I want to see you in 2 months for your physical and labs We did update your flu vaccine and pneumonia vaccine today

## 2024-10-24 NOTE — Assessment & Plan Note (Signed)
 History of the same followed by urology.  Patient on dutasteride  0.5 mg daily and tamsulosin  0.8 mg daily.  Continue medication as prescribed.  Follow specialist as recommended.

## 2024-10-24 NOTE — Assessment & Plan Note (Signed)
 Currently maintained on atorvastatin  40 mg and fenofibrate  145 mg daily.  Continue medication as prescribed

## 2024-10-24 NOTE — Progress Notes (Addendum)
 New Patient Office Visit  Subjective    Patient ID: Phillip Rodgers, male    DOB: 1961/11/08  Age: 63 y.o. MRN: [id number]  CC:  Chief Complaint  Patient presents with   Establish Care    Flu vaccine     HPI Ab Leaming presents to establish care   DM2: Patient currently maintained on metformin  2000 mg daily and pioglitazone  30 mg daily. Doe snot check it at home. Does have the supplies.  Goes to the gym daily and will walk for about an hour  ED: Currently maintained on sildenafil  40 to 100 mg daily as needed  BPH: Currently maintained on tamsulosin  0.8 mg daily and dutasteride  0.5 mg every morning.  Patient is followed by urology. History of cycstocopy and is getting some improvement. Nocutria x2  HTN: Currently maintained on amlodipine  10 mg - benazepril  20mg  He mentions that he has a combination pill with the amlodipine    HLD: Currently maintained on atorvastatin  40 mg daily and fenofibrate  145 mg daily  Copd?: states that he was on [place]ort from [place]. He iwll use it as needed and does not have an albuterol inhaler. Smoked for an extended period of time  Tdap: 2019 COVID: original and boosters PNA:up date today  Shingles: Completed Flu: up date today   Colonoscopy:Cologuard done 05/16/2020 that was negative.  Patient due. Order cologuard today  PSA: Due  Outpatient Encounter Medications as of 10/24/2024  Medication Sig   Accu-Chek Softclix Lancets lancets Use as instructed   amLODipine -benazepril  (LOTREL) 10-20 MG capsule Take 1 capsule by mouth daily.   ascorbic acid (VITAMIN C) 500 MG tablet Take by mouth.   atorvastatin  (LIPITOR) 40 MG tablet Take 1 tablet (40 mg total) by mouth at bedtime.   Blood Glucose Monitoring Suppl (ACCU-CHEK GUIDE) w/Device KIT Use device to check sugar up to  three times daily   dutasteride  (AVODART ) 0.5 MG capsule Take 1 capsule (0.5 mg total) by mouth every morning.   fenofibrate  (TRICOR ) 145 MG tablet TAKE 1 TABLET BY MOUTH  DAILY   glucose blood (ACCU-CHEK GUIDE TEST) test strip Use as instructed   ketoconazole (NIZORAL) 2 % cream    metFORMIN  (GLUCOPHAGE -XR) 500 MG 24 hr tablet Take 4 tablets (2,000 mg total) by mouth daily with breakfast.   Multiple Vitamin (MULTI-VITAMIN) tablet Take 1 tablet by mouth daily.   pioglitazone  (ACTOS ) 30 MG tablet Take 1 tablet (30 mg total) by mouth daily.   sildenafil  (REVATIO ) 20 MG tablet TAKE 2-5 TABLETS BY MOUTH AS NEEDED FOR ERECTILE DYSFUNCTION   tamsulosin  (FLOMAX ) 0.4 MG CAPS capsule Take 2 capsules (0.8 mg total) by mouth daily.   [DISCONTINUED] amLODipine  (NORVASC ) 10 MG tablet TAKE 1 TABLET BY MOUTH DAILY   No facility-administered encounter medications on file as of 10/24/2024.    Past Medical History:  Diagnosis Date   Diabetes mellitus without complication (HCC)    Hyperlipidemia    Hypertension     Past Surgical History:  Procedure Laterality Date   GALLBLADDER SURGERY      Family History  Problem Relation Age of Onset   Cancer Mother 47       leukemia   Hyperlipidemia Mother    Hypertension Mother    Hypertension Father    Hyperlipidemia Father    Cardiomyopathy Father     Social History   Socioeconomic History   Marital status: Married    Spouse name: Evavegline   Number of children: Not on file   Years  of education: Not on file   Highest education level: Not on file  Occupational History   Not on file  Tobacco Use   Smoking status: Former    Current packs/day: 0.00    Types: Cigarettes    Start date: 67    Quit date: 2008    Years since quitting: 17.9   Smokeless tobacco: Never  Vaping Use   Vaping status: Not on file  Substance and Sexual Activity   Alcohol use: Never   Drug use: Never   Sexual activity: Yes  Other Topics Concern   Not on file  Social History Narrative   Not on file   Social Drivers of Health   Financial Resource Strain: Not on file  Food Insecurity: Not on file  Transportation Needs: Not on file   Physical Activity: Not on file  Stress: Not on file  Social Connections: Not on file  Intimate Partner Violence: Not on file    Review of Systems  Constitutional:  Negative for chills and fever.  Respiratory:  Negative for shortness of breath.   Cardiovascular:  Negative for chest pain and leg swelling.  Gastrointestinal:  Negative for abdominal pain, blood in stool, constipation, diarrhea, nausea and vomiting.       Bm daily   Genitourinary:  Negative for dysuria and hematuria.       Nocturia + x 2  Neurological:  Negative for tingling and headaches.  Psychiatric/Behavioral:  Negative for hallucinations and suicidal ideas.         Objective    BP 118/64   Pulse 64   Temp 98.1 F (36.7 C) (Oral)   Ht 5' 8 (1.727 m)   Wt 156 lb 12.8 oz (71.1 kg)   SpO2 98%   BMI 23.84 kg/m   Physical Exam Vitals and nursing note reviewed.  Constitutional:      Appearance: Normal appearance.  HENT:     Right Ear: Tympanic membrane, ear canal and external ear normal.     Left Ear: Tympanic membrane, ear canal and external ear normal.     Mouth/Throat:     Mouth: Mucous membranes are moist.     Pharynx: Oropharynx is clear.  Eyes:     Extraocular Movements: Extraocular movements intact.     Pupils: Pupils are equal, round, and reactive to light.  Cardiovascular:     Rate and Rhythm: Normal rate and regular rhythm.     Pulses: Normal pulses.     Heart sounds: Normal heart sounds.  Pulmonary:     Effort: Pulmonary effort is normal.     Breath sounds: Normal breath sounds.  Musculoskeletal:     Right lower leg: No edema.     Left lower leg: No edema.  Lymphadenopathy:     Cervical: No cervical adenopathy.  Skin:    General: Skin is warm.  Neurological:     General: No focal deficit present.     Mental Status: He is alert.     Deep Tendon Reflexes:     Reflex Scores:      Bicep reflexes are 2+ on the right side and 2+ on the left side.      Patellar reflexes are 2+ on  the right side and 2+ on the left side.    Comments: Bilateral upper and lower extremity strength 5/5  Psychiatric:        Mood and Affect: Mood normal.        Behavior: Behavior normal.  Thought Content: Thought content normal.        Judgment: Judgment normal.     Title   Diabetic Foot Exam - detailed Is there a history of foot ulcer?: No Is there a foot ulcer now?: No Is there swelling?: No Is there elevated skin temperature?: No Is there abnormal foot shape?: No Is there a claw toe deformity?: No Are the toenails long?: No Are the toenails thick?: No Are the toenails ingrown?: No Pulse Foot Exam completed.: Yes   Right Posterior Tibialis: Present Left posterior Tibialis: Present   Right Dorsalis Pedis: Present Left Dorsalis Pedis: Present     Sensory Foot Exam Completed.: Yes Semmes-Weinstein Monofilament Test + means has sensation and - means no sensation   R Site 1-Great Toe: Neg    R Site 4: Neg       Image components are not supported.   Image components are not supported. Image components are not supported.  T[place] Comments Absent on 1,2,4 on the right foot  Present on all 10 sites on left food. But diminished on 1,2,4,5        Assessment & Plan:   Problem List Items Addressed This Visit       Cardiovascular and Mediastinum   Primary hypertension - Primary   Patient maintained on amlodipine  10 mg daily.  Question additional agent as patient states he is on a combination amlodipine  pill we will verify with pharmacy once they are open.  Continue taking prescribed medications  Called pharmacy he is taking amlodipine  10mg  benazepril  20mg  daily       Relevant Medications   amLODipine -benazepril  (LOTREL) 10-20 MG capsule     Endocrine   Diabetic peripheral neuropathy associated with type 2 diabetes mellitus (HCC)   Patient currently maintained on metformin  2000 mg daily and pioglitazone  30 mg daily.  Last A1c well-controlled at  6.7%.  Patient does not check glucose at home but has supplies if needed.  Denies hypoglycemia continue medications as prescribed      Relevant Medications   amLODipine -benazepril  (LOTREL) 10-20 MG capsule     Other   BPH associated with nocturia   History of the same followed by urology.  Patient on dutasteride  0.5 mg daily and tamsulosin  0.8 mg daily.  Continue medication as prescribed.  Follow specialist as recommended.      Mixed hyperlipidemia   Currently maintained on atorvastatin  40 mg and fenofibrate  145 mg daily.  Continue medication as prescribed      Relevant Medications   amLODipine -benazepril  (LOTREL) 10-20 MG capsule   Other Visit Diagnoses       Need for vaccination against Streptococcus pneumoniae       Relevant Orders   Pneumococcal conjugate vaccine 20-valent (PCV20) (Completed)     Encounter for medical examination to establish care         Screening for colon cancer       Relevant Orders   Cologuard     Screening for lung cancer       Relevant Orders   Ambulatory Referral Lung Cancer Screening [facility]     Need for influenza vaccination       Relevant Orders   Flu vaccine trivalent PF, 6mos and older(Flulaval,Afluria,Fluarix,Fluzone) (Completed)       Return in about 2 months (around 12/24/2024) for CPE and Labs.   Adina Crandall, NP

## 2024-10-24 NOTE — Addendum Note (Signed)
 Addended by: WENDEE LYNWOOD HERO on: 10/24/2024 10:05 AM   Modules accepted: Orders

## 2024-11-21 ENCOUNTER — Other Ambulatory Visit: Payer: Self-pay | Admitting: Nurse Practitioner

## 2024-11-21 DIAGNOSIS — E782 Mixed hyperlipidemia: Secondary | ICD-10-CM

## 2024-11-21 DIAGNOSIS — E1142 Type 2 diabetes mellitus with diabetic polyneuropathy: Secondary | ICD-10-CM

## 2024-11-21 DIAGNOSIS — E119 Type 2 diabetes mellitus without complications: Secondary | ICD-10-CM

## 2024-11-21 DIAGNOSIS — N401 Enlarged prostate with lower urinary tract symptoms: Secondary | ICD-10-CM

## 2024-11-21 MED ORDER — ACCU-CHEK GUIDE TEST VI STRP: ORAL_STRIP | 12 refills | Status: AC

## 2024-11-21 MED ORDER — AMLODIPINE BESY-BENAZEPRIL HCL 10-20 MG PO CAPS: 1.0000 | ORAL_CAPSULE | Freq: Every day | ORAL | 1 refills | Status: AC

## 2024-11-21 MED ORDER — SILDENAFIL CITRATE 20 MG PO TABS: ORAL_TABLET | ORAL | 2 refills | Status: AC

## 2024-11-21 MED ORDER — DUTASTERIDE 0.5 MG PO CAPS: 0.5000 mg | ORAL_CAPSULE | Freq: Every morning | ORAL | 1 refills | Status: AC

## 2024-11-21 MED ORDER — PIOGLITAZONE HCL 30 MG PO TABS: 30.0000 mg | ORAL_TABLET | Freq: Every day | ORAL | 1 refills | Status: AC

## 2024-11-21 MED ORDER — FENOFIBRATE 145 MG PO TABS: 145.0000 mg | ORAL_TABLET | Freq: Every day | ORAL | 1 refills | Status: AC

## 2024-11-21 MED ORDER — ATORVASTATIN CALCIUM 40 MG PO TABS: 40.0000 mg | ORAL_TABLET | Freq: Every day | ORAL | 1 refills | Status: AC

## 2024-11-21 MED ORDER — METFORMIN HCL ER 500 MG PO TB24: 2000.0000 mg | ORAL_TABLET | Freq: Every day | ORAL | 1 refills | Status: AC

## 2024-11-21 MED ORDER — ACCU-CHEK GUIDE W/DEVICE KIT: PACK | 0 refills | Status: AC

## 2024-11-21 NOTE — Telephone Encounter (Signed)
 Copied from CRM [id number]. Topic: Clinical - Medication Refill >> Nov 21, 2024 12:11 PM Deaijah H wrote: Medication: Accu-Chek Softclix Lancets lancets amLODipine -benazepril  (LOTREL) 10-20 MG capsule ascorbic acid (VITAMIN C) 500 MG tablet atorvastatin  (LIPITOR) 40 MG tablet Blood Glucose Monitoring Suppl (ACCU-CHEK GUIDE) w/Device KIT dutasteride  (AVODART ) 0.5 MG capsule fenofibrate  (TRICOR ) 145 MG tablet glucose blood (ACCU-CHEK GUIDE TEST) test strip ketoconazole (NIZORAL) 2 % cream metFORMIN  (GLUCOPHAGE -XR) 500 MG 24 hr tablet Multiple Vitamin (MULTI-VITAMIN) tablet pioglitazone  (ACTOS ) 30 MG tablet sildenafil  (REVATIO ) 20 MG tablet tamsulosin  (FLOMAX ) 0.4 MG CAPS capsule    Has the patient contacted their pharmacy? No (Agent: If no, request that the patient contact the pharmacy for the refill. If patient does not wish to contact the pharmacy document the reason why and proceed with request.) Due to being told to contact PCP (Agent: If yes, when and what did the pharmacy advise?)  This is the patient's preferred pharmacy:  [facility] [id number] [place], [place] - [address] [place] [address] Phone: [phone] Fax: [phone]  Is this the correct pharmacy for this prescription? Yes If no, delete pharmacy and type the correct one.   Has the prescription been filled recently? Yes  Is the patient out of the medication? Yes  Has the patient been seen for an appointment in the last year OR does the patient have an upcoming appointment? Yes  Can we respond through MyChart? Yes  Agent: Please be advised that Rx refills may take up to 3 business days. We ask that you follow-up with your pharmacy.

## 2024-12-04 ENCOUNTER — Ambulatory Visit: Payer: Self-pay | Admitting: Internal Medicine

## 2025-01-18 ENCOUNTER — Ambulatory Visit: Admitting: Physician Assistant
